# Patient Record
Sex: Female | Born: 1965 | Race: White | Hispanic: No | Marital: Single | Smoking: Never smoker
Health system: Southern US, Community
[De-identification: ages and names within clinical notes are randomized; demographics above are authoritative.]

## PROBLEM LIST (undated history)

## (undated) DIAGNOSIS — K449 Diaphragmatic hernia without obstruction or gangrene: Secondary | ICD-10-CM

## (undated) DIAGNOSIS — K633 Ulcer of intestine: Secondary | ICD-10-CM

## (undated) DIAGNOSIS — K259 Gastric ulcer, unspecified as acute or chronic, without hemorrhage or perforation: Secondary | ICD-10-CM

## (undated) DIAGNOSIS — M1990 Unspecified osteoarthritis, unspecified site: Secondary | ICD-10-CM

## (undated) DIAGNOSIS — K562 Volvulus: Secondary | ICD-10-CM

## (undated) DIAGNOSIS — K219 Gastro-esophageal reflux disease without esophagitis: Secondary | ICD-10-CM

## (undated) DIAGNOSIS — R011 Cardiac murmur, unspecified: Secondary | ICD-10-CM

## (undated) HISTORY — DX: Unspecified osteoarthritis, unspecified site: M19.90

## (undated) HISTORY — DX: Ulcer of intestine: K63.3

## (undated) HISTORY — PX: COLONOSCOPY: SHX174

## (undated) HISTORY — DX: Cardiac murmur, unspecified: R01.1

## (undated) HISTORY — DX: Volvulus: K56.2

## (undated) HISTORY — DX: Gastric ulcer, unspecified as acute or chronic, without hemorrhage or perforation: K25.9

## (undated) HISTORY — DX: Diaphragmatic hernia without obstruction or gangrene: K44.9

## (undated) HISTORY — DX: Gastro-esophageal reflux disease without esophagitis: K21.9

## (undated) HISTORY — PX: UPPER GASTROINTESTINAL ENDOSCOPY: SHX188

---

## 2005-07-28 ENCOUNTER — Ambulatory Visit: Payer: Self-pay | Admitting: Family Medicine

## 2006-11-16 ENCOUNTER — Ambulatory Visit: Payer: Self-pay | Admitting: Gastroenterology

## 2006-11-24 ENCOUNTER — Ambulatory Visit: Payer: Self-pay | Admitting: Gastroenterology

## 2006-11-27 ENCOUNTER — Ambulatory Visit: Payer: Self-pay | Admitting: Gastroenterology

## 2006-11-29 ENCOUNTER — Ambulatory Visit: Payer: Self-pay | Admitting: Gastroenterology

## 2006-11-29 LAB — CONVERTED CEMR LAB
CO2: 32 meq/L (ref 19–32)
Chloride: 103 meq/L (ref 96–112)
GFR calc Af Amer: 320 mL/min

## 2006-12-02 ENCOUNTER — Encounter: Admission: RE | Admit: 2006-12-02 | Discharge: 2006-12-02 | Payer: Self-pay | Admitting: Gastroenterology

## 2007-07-16 ENCOUNTER — Ambulatory Visit: Payer: Self-pay | Admitting: Gastroenterology

## 2007-09-03 ENCOUNTER — Ambulatory Visit: Payer: Self-pay | Admitting: Gastroenterology

## 2011-02-22 NOTE — Assessment & Plan Note (Signed)
[facility]                         GASTROENTEROLOGY OFFICE NOTE   Dellis Anes                      MRN:          [id number]  DATE:09/03/2007                            DOB:          [birth year]    [name] returns with a dramatic improvement in symptoms. She was  eating salads very frequently and discontinued this. She also moderated  her fiber intake. In addition, she completed a course of Xifaxan. She  has been feeling much better and notes only occasional mild bloating.  She has not had any significant diarrhea or constipation. She states  that she has used clinidium only once in the past few weeks. I have  reviewed her records that were received from her gastroenterologist in  [place] and they are included on the chart.   CURRENT MEDICATIONS:  As listed on the chart; updated and reviewed.   MEDICATION ALLERGIES:  BACTRIM, MACROBID, CIPRO AND FLAGYL.   PHYSICAL EXAMINATION:  In no acute distress. Weight 131.2 pounds, blood  pressure 98/52, pulse 70 and regular. She is not re-examined.   ASSESSMENT/PLAN:  Irritable bowel syndrome. Maintain a moderate fiber  diet with adequate fluid intake. Avoid salads and milk products. May use  MiraLax and clinidium as needed. Return office visit with me p.r.n.     Venita Lick. Russella Dar, MD, [facility]  Electronically Signed    MTS/MedQ  DD: 09/03/2007  DT: 09/03/2007  Job #: [id number]   cc:   Paulita Cradle, FNP

## 2011-02-25 NOTE — Assessment & Plan Note (Signed)
[facility]                         GASTROENTEROLOGY OFFICE NOTE   Dellis Anes                      MRN:          [id number]  DATE:07/16/2007                            DOB:          1965/12/18    REFERRING PHYSICIAN:  Birdena Jubilee, NP   REASON FOR CONSULT:  Abdominal pain, gas, bloating, hematochezia, and  alternating diarrhea and constipation.   HISTORY OF PRESENT ILLNESS:  Ms. Sandra Ortiz is a 45 year old white  female that I have previously evaluated.  She underwent upper endoscopy  for epigastric pain in February 2008 that was normal.  She was treated  for possible reflux.  Abdominal ultrasound showed a 2.4 mm heterogenous  hyperechoic hepatic lesion, and was otherwise unremarkable.  An MR of  the abdomen with and without contrast on December 02, 2006, showed the  liver lesion had characteristics of a benign hepatic hemangioma.  Small  benign renal cysts were noted.  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  Liver function tests from February 2008 were normal.  She  underwent colonoscopy previously by Dr. Diamantina Monks in [place],  [place], in December 2006.  I have biopsy reports but not the procedure  report.  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  Stool studies from November 2006 for enteric pathogens and ova  and parasites were negative.  In addition, she had a small bowel series  performed in January 2007, in [place], [place] that was normal.  She  relates no weight loss or appetite change.  She states the pain has been  particularly bothersome for the past 3 months.  It occurred on a daily  basis and is generally in the left lower quadrant.  She has significant  gas and bloating after meals associated with visible swelling.  She  occasionally can go several days between bowel movements and then other  days she  has 4 or more bowel movements a day.  She has noted bright red  rectal bleeding with constipation and straining.  Family history is  negative for colon cancer, colon polyps, or inflammatory bowel disease.   PAST MEDICAL HISTORY:  1. Spinal arthritis and as in the history of present illness.  2. She is status post an exploratory surgery 20 years ago.   CURRENT MEDICATIONS:  Listed on the chart, updated, and reviewed.   MEDICATION ALLERGIES:  BACTRIM, MACROBID, CIPRO, AND FLAGYL.   SOCIAL HISTORY AND REVIEW OF SYSTEMS:  Per the handwritten form.   PHYSICAL EXAM:  Well-developed, well-nourished, no acute distress.  Height 5 feet 2 inches, weight 130.2 pounds, blood pressure 102/66,  pulse 80 and regular.  HEENT:  Anicteric sclerae, oropharynx clear.  CHEST:  Clear to auscultation bilaterally.  CARDIAC:  Regular rate and rhythm without murmurs appreciated.  ABDOMEN:  Soft with minimal left lower quadrant tenderness to deep  palpation.  No rebound or guarding.  No palpable organomegaly, masses,  or hernias.  Normoactive bowel sounds.  NEUROLOGIC:  Alert and oriented x3.  Grossly nonfocal.   ASSESSMENT AND PLAN:  Alternating diarrhea and constipation associated  with small volume hematochezia.  I suspect she has underlying irritable  bowel syndrome and a benign anorectal source of bleeding.  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  She does not  have ongoing diarrhea so her current symptoms are not typical for an  active diagnosis of microscopic colitis. Begin a high fiber diet with  adequate fluid intake, begin a course of Xifaxan 400 mg t.i.d. x10 days.  She is advised to use her Clinidium b.i.d.  She may use MiraLax 1 to 3  times daily for management of constipation, and she will hold this for  diarrhea.  Obtain her colonoscopy report from Ali Molina. Consider a  repeat colonoscopy for further evaluation. Consider serologies for  celiac disease and  inflammatory bowel disease. Return office visit in 6-  8 weeks.     Venita Lick. Russella Dar, MD, [facility]  Electronically Signed    MTS/MedQ  DD: 07/30/2007  DT: 07/31/2007  Job #: [id number]   cc:   Birdena Jubilee, NP

## 2011-11-14 ENCOUNTER — Encounter: Payer: Self-pay | Admitting: Cardiology

## 2011-11-14 ENCOUNTER — Ambulatory Visit (INDEPENDENT_AMBULATORY_CARE_PROVIDER_SITE_OTHER): Payer: BC Managed Care – PPO | Admitting: Cardiology

## 2011-11-14 VITALS — BP 138/85 | HR 98 | Resp 16 | Ht 62.0 in | Wt 137.0 lb

## 2011-11-14 DIAGNOSIS — R002 Palpitations: Secondary | ICD-10-CM

## 2011-11-14 DIAGNOSIS — R079 Chest pain, unspecified: Secondary | ICD-10-CM | POA: Insufficient documentation

## 2011-11-14 DIAGNOSIS — R011 Cardiac murmur, unspecified: Secondary | ICD-10-CM

## 2011-11-14 NOTE — Progress Notes (Signed)
   Clinical Summary Sandra Ortiz is a 46 y.o.female referred by Sandra Ortiz for evaluation of chest pain. We discussed her symptoms. She reports onset of a sporadic chest discomfort, initially dull then sharp, beginning back in November of 2012. Symptoms were always at rest, never exertional. Episodes would last for a few minutes, went away without any specific intervention. These symptoms gradually progressed in intensity and frequency, and she began to experience some discomfort in her left arm with paresthesias in her left forearm. Again, none of these have been exertional. She was however scared to continue her usual exercise regimen on the treadmill each morning.  She also indicates during this time a sensation of "fluttering" in her chest, not always with chest discomfort however. She had no dizziness or syncope.  Recent ECG reviewed showing sinus rhythm with short PR, no definite delta waves.  She reports no history of cardiac arrhythmia. States she was told she had a heart murmur fairly recently, was not aware of this as a child.   Allergies  Allergen Reactions  . Ciprofloxacin   . Flagyl (Metronidazole Hcl)   . Macrobid   . Sulphur (Sulfur Sublimed)     Current Outpatient Prescriptions  Medication Sig Dispense Refill  . Ascorbic Acid (VITAMIN C PO) Take by mouth daily.      . Glucosamine-Chondroitin (GLUCOSAMINE CHONDR COMPLEX PO) Take by mouth daily.      . Multiple Minerals (CALCIUM/MAGNESIUM/ZINC PO) Take by mouth daily.      . Multiple Vitamins-Minerals (MULTI COMPLETE PO) Take by mouth daily.      . Probiotic Product (ALIGN PO) Take by mouth daily.        Past Medical History  Diagnosis Date  . Heart murmur     History reviewed. No pertinent past surgical history.  Family History  Problem Relation Age of Onset  . Coronary artery disease Maternal Grandmother   . Coronary artery disease Maternal Aunt     Social History Sandra Ortiz reports that she has never smoked. She  has never used smokeless tobacco. Sandra Ortiz reports that she does not drink alcohol.  Review of Systems No recent fevers or chills. Reports yearly influenza vaccine. No sudden syncope, no orthopnea or PND. Reports stable appetite. No melena or hematochezia. Otherwise reviewed and negative.  Physical Examination Filed Vitals:   11/14/11 0958  BP: 138/85  Pulse: 98  Resp: 16   Normally nourished appearing woman in no acute distress. HEENT: Conjunctiva and lids normal, oropharynx clear with moist mucosa. Neck: Supple, no elevated JVP or carotid bruits, no thyromegaly. Lungs: Clear to auscultation, nonlabored breathing at rest. Cardiac: Regular rate and rhythm, no S3, 2/6 systolic murmur at base, no pericardial rub. Abdomen: Soft, nontender, bowel sounds present, no guarding or rebound. Extremities: No pitting edema, distal pulses 2+. Skin: Warm and dry. Musculoskeletal: No kyphosis. Neuropsychiatric: Alert and oriented x3, affect grossly appropriate.   ECG Normal sinus rhythm with PR 120, no delta waves.    Problem List and Plan

## 2011-11-14 NOTE — Assessment & Plan Note (Signed)
She has had palpitations, new since November with above symptoms, although not consistently. She reports symptoms at least once a week. Would like to obtain an event recorder to investigate further, exclude any intermittent arrhythmias that might need further evaluation. As noted, PR interval is borderline short, although there is no definite evidence of preexcitation.

## 2011-11-14 NOTE — Patient Instructions (Signed)
**Note De-Identified Sandra Ortiz Obfuscation** Your physician has recommended that you wear an event monitor. Event monitors are medical devices that record the heart's electrical activity. Doctors most often [place] these monitors to diagnose arrhythmias. Arrhythmias are problems with the speed or rhythm of the heartbeat. The monitor is a small, portable device. You can wear one while you do your normal daily activities. This is usually used to diagnose what is causing palpitations/syncope (passing out).  Your physician has requested that you have an echocardiogram. Echocardiography is a painless test that uses sound waves to create images of your heart. It provides your doctor with information about the size and shape of your heart and how well your heart's chambers and valves are working. This procedure takes approximately one hour. There are no restrictions for this procedure.  Your physician recommends that you continue on your current medications as directed. Please refer to the Current Medication list given to you today.  Your physician recommends that you schedule a follow-up appointment in: 2 to 3 weeks

## 2011-11-14 NOTE — Assessment & Plan Note (Signed)
Very atypical in description, nonexertional, not associated with any increasing shortness of breath. She has had some left arm symptoms associated as well, although could be neuropathic based on description. She has had some palpitations with these symptoms, although not consistently. Her ECG shows a borderline short PR interval, no definite preexcitation however, otherwise normal. Likelihood of ischemia as etiology is quite low.

## 2011-11-14 NOTE — Assessment & Plan Note (Signed)
Reportedly noted as an adult. Particularly in light of above symptoms as well, a 2-D echocardiogram will be obtained.

## 2011-11-15 ENCOUNTER — Ambulatory Visit (HOSPITAL_COMMUNITY)
Admission: RE | Admit: 2011-11-15 | Discharge: 2011-11-15 | Disposition: A | Discharge status: Home / Self Care | Payer: BC Managed Care – PPO | Source: Ambulatory Visit | Attending: Cardiology | Admitting: Cardiology

## 2011-11-15 DIAGNOSIS — R079 Chest pain, unspecified: Secondary | ICD-10-CM | POA: Insufficient documentation

## 2011-11-15 DIAGNOSIS — Z8249 Family history of ischemic heart disease and other diseases of the circulatory system: Secondary | ICD-10-CM | POA: Insufficient documentation

## 2011-11-15 DIAGNOSIS — R011 Cardiac murmur, unspecified: Secondary | ICD-10-CM

## 2011-11-15 NOTE — Progress Notes (Signed)
*  PRELIMINARY RESULTS* Echocardiogram 2D Echocardiogram has been performed.  Sandra Ortiz 11/15/2011, 12:46 PM

## 2011-11-16 ENCOUNTER — Telehealth: Payer: Self-pay

## 2011-11-16 NOTE — Telephone Encounter (Signed)
Pt. advised of Echo results, she verbalized understanding./LV

## 2011-11-24 ENCOUNTER — Ambulatory Visit: Payer: Self-pay | Admitting: Cardiovascular Disease

## 2011-12-02 ENCOUNTER — Ambulatory Visit: Payer: BC Managed Care – PPO | Admitting: Cardiology

## 2012-01-03 ENCOUNTER — Telehealth: Payer: Self-pay

## 2012-01-03 NOTE — Telephone Encounter (Signed)
**Note De-Identified Sandra Ortiz Obfuscation** Pt. only has cell phone and [employer] was advised of this when event monitor was ordered.They stated, at that time, that this would not be a problem. We only received 1 strip at the end of pt's 7 day transmission. Pt. states that she tired for hours multiple times throughout several nights to download her monitor and was finally advised by [employer] that she has to use a land line phone. Pt. States that she then tried to download monitor while at work but was put on hold for over a half hour period each time. Pt. states that she got frustrated and sent monitor back to [employer]. Becky, with [employer], states that pt. lost transmission and never called to obtain another baseline. I left a message on Abbie Louden's, account executive for [employer], VM asking her to call me concerning this matter./LV

## 2012-01-05 NOTE — Telephone Encounter (Signed)
**Note De-Identified Sandra Ortiz Obfuscation** Efrain Sella came by office this morning and stated that [employer]'s records do not show that pt. contacted them to down load monitor or that she was placed on hold as pt. reported. Efrain Sella also states that she is continuing to try to get another event monitor to pt. and that she will update [place] on progress./LV

## 2012-01-10 ENCOUNTER — Other Ambulatory Visit: Payer: Self-pay | Admitting: Cardiology

## 2012-01-10 DIAGNOSIS — R002 Palpitations: Secondary | ICD-10-CM

## 2013-09-16 ENCOUNTER — Encounter: Payer: Self-pay | Admitting: Family Medicine

## 2013-09-16 ENCOUNTER — Ambulatory Visit (INDEPENDENT_AMBULATORY_CARE_PROVIDER_SITE_OTHER): Payer: BC Managed Care – PPO | Admitting: Family Medicine

## 2013-09-16 VITALS — BP 102/69 | HR 71 | Temp 97.4°F | Wt 138.0 lb

## 2013-09-16 DIAGNOSIS — J40 Bronchitis, not specified as acute or chronic: Secondary | ICD-10-CM

## 2013-09-16 DIAGNOSIS — R062 Wheezing: Secondary | ICD-10-CM

## 2013-09-16 DIAGNOSIS — J069 Acute upper respiratory infection, unspecified: Secondary | ICD-10-CM

## 2013-09-16 MED ORDER — BENZONATATE 100 MG PO CAPS: 100.0000 mg | ORAL_CAPSULE | Freq: Three times a day (TID) | ORAL | Status: DC | PRN

## 2013-09-16 MED ORDER — DOXYCYCLINE HYCLATE 100 MG PO CAPS: 100.0000 mg | ORAL_CAPSULE | Freq: Two times a day (BID) | ORAL | Status: DC

## 2013-09-16 MED ORDER — METHYLPREDNISOLONE ACETATE 80 MG/ML IJ SUSP
80.0000 mg | Freq: Once | INTRAMUSCULAR | Status: AC
  Administered 2013-09-16: 80 mg via INTRAMUSCULAR

## 2013-09-16 MED ORDER — METHYLPREDNISOLONE ACETATE 40 MG/ML IJ SUSP: 80.0000 mg | Freq: Once | INTRAMUSCULAR | Status: DC

## 2013-09-16 NOTE — Progress Notes (Signed)
   Subjective:    Patient ID: Sandra Ortiz, female    DOB: [birth year], 47 y.o.   MRN: [id number]  HPI URI Symptoms Onset: 7-10 days  Description: sinus pressure, nasal congestion, post nasal drip cough, wheezing  Modifying factors:  none  Symptoms Nasal discharge: yes Fever: no Sore throat: no Cough: yes Wheezing: yes Ear pain: no GI symptoms: no Sick contacts: yes  Red Flags  Stiff neck: no Dyspnea: no Rash: no Swallowing difficulty: no  Sinusitis Risk Factors Headache/face pain: no Double sickening: no tooth pain: no  Allergy Risk Factors Sneezing: no Itchy scratchy throat: no Seasonal symptoms: no  Flu Risk Factors Headache: no muscle aches: no severe fatigue: no     Review of Systems  All other systems reviewed and are negative.       Objective:   Physical Exam  Constitutional: She appears well-developed and well-nourished.  HENT:  Head: Normocephalic and atraumatic.  Right Ear: External ear normal.  Left Ear: External ear normal.  +nasal erythema, rhinorrhea bilaterally, + post oropharyngeal erythema    Eyes: Conjunctivae are normal. Pupils are equal, round, and reactive to light.  Neck: Normal range of motion. Neck supple.  Cardiovascular: Normal rate and regular rhythm.   Pulmonary/Chest: Effort normal.  Faint wheezes in bases   Abdominal: Soft.  Musculoskeletal: Normal range of motion.  Neurological: She is alert.  Skin: Skin is warm.          Assessment & Plan:  URI (upper respiratory infection)  Wheezing - Plan: methylPREDNISolone acetate (DEPO-MEDROL) injection 80 mg  Bronchitis - Plan: doxycycline (VIBRAMYCIN) 100 MG capsule, benzonatate (TESSALON) 100 MG capsule  Suspect viral source of sxs overall  Depomedrol 80mg  IM x1 for wheezing  Will place on course of doxy for lower and upper resp coverage give duration of sxs.  Tessalon perles for cough  Discussed infectious and resp red flags at length with pt.  Follow up  as needed.

## 2013-10-01 ENCOUNTER — Telehealth: Payer: Self-pay | Admitting: Family Medicine

## 2013-10-10 NOTE — Telephone Encounter (Signed)
This is likely viral  Was on extended course of doxy- should have been adequate coverage.  Will need a follow up appt if she is not better. May need course of steroids if she is wheezing.

## 2013-10-15 NOTE — Telephone Encounter (Signed)
Left detailed message on patients personal voice mail.

## 2014-10-20 ENCOUNTER — Telehealth: Payer: Self-pay | Admitting: Nurse Practitioner

## 2014-10-20 NOTE — Telephone Encounter (Signed)
Appointment given for Friday but notified if the pain gets worse to call us back or go to the ER.

## 2014-10-24 ENCOUNTER — Encounter: Payer: Self-pay | Admitting: Family Medicine

## 2014-10-24 ENCOUNTER — Ambulatory Visit (INDEPENDENT_AMBULATORY_CARE_PROVIDER_SITE_OTHER): Payer: BC Managed Care – PPO

## 2014-10-24 ENCOUNTER — Ambulatory Visit (INDEPENDENT_AMBULATORY_CARE_PROVIDER_SITE_OTHER): Payer: BC Managed Care – PPO | Admitting: Family Medicine

## 2014-10-24 VITALS — BP 121/76 | HR 89 | Temp 98.0°F | Ht 62.0 in | Wt 145.8 lb

## 2014-10-24 DIAGNOSIS — R1031 Right lower quadrant pain: Secondary | ICD-10-CM

## 2014-10-24 LAB — POCT CBC
GRANULOCYTE PERCENT: 68.5 % (ref 37–80)
HCT, POC: 41.2 % (ref 37.7–47.9)
HEMOGLOBIN: 13.2 g/dL (ref 12.2–16.2)
Lymph, poc: 2.1 (ref 0.6–3.4)
MCH, POC: 28.4 pg (ref 27–31.2)
MCHC: 32.1 g/dL (ref 31.8–35.4)
MCV: 88.5 fL (ref 80–97)
MPV: 7.3 fL (ref 0–99.8)
POC GRANULOCYTE: 5 (ref 2–6.9)
POC LYMPH PERCENT: 28.3 %L (ref 10–50)
Platelet Count, POC: 362 10*3/uL (ref 142–424)
RBC: 4.7 M/uL (ref 4.04–5.48)
RDW, POC: 12.6 %
WBC: 7.3 10*3/uL (ref 4.6–10.2)

## 2014-10-24 NOTE — Patient Instructions (Signed)
Constipation Constipation is when a person has fewer than three bowel movements a week, has difficulty having a bowel movement, or has stools that are dry, hard, or larger than normal. As people grow older, constipation is more common. If you try to fix constipation with medicines that make you have a bowel movement (laxatives), the problem may get worse. Long-term laxative use may cause the muscles of the colon to become weak. A low-fiber diet, not taking in enough fluids, and taking certain medicines may make constipation worse.  CAUSES   Certain medicines, such as antidepressants, pain medicine, iron supplements, antacids, and water pills.   Certain diseases, such as diabetes, irritable bowel syndrome (IBS), thyroid disease, or depression.   Not drinking enough water.   Not eating enough fiber-rich foods.   Stress or travel.   Lack of physical activity or exercise.   Ignoring the urge to have a bowel movement.   Using laxatives too much.  SIGNS AND SYMPTOMS   Having fewer than three bowel movements a week.   Straining to have a bowel movement.   Having stools that are hard, dry, or larger than normal.     Maintenance: USe colace 100 mg twice daily to soften stool. Metamucil or similar fiber supplement, 1 tbsp twice a day  Use either a colon cleanse from [facility] Or buy Magnesium Citrate 10 oz. Bottle 1 daily until   Feeling full or bloated.   Pain in the lower abdomen.   Not feeling relief after having a bowel movement.  DIAGNOSIS  Your health care provider will take a medical history and perform a physical exam. Further testing may be done for severe constipation. Some tests may include:  A barium enema X-ray to examine your rectum, colon, and, sometimes, your small intestine.   A sigmoidoscopy to examine your lower colon.   A colonoscopy to examine your entire colon. TREATMENT  Treatment will depend on the severity of your constipation and what is  causing it. Some dietary treatments include drinking more fluids and eating more fiber-rich foods. Lifestyle treatments may include regular exercise. If these diet and lifestyle recommendations do not help, your health care provider may recommend taking over-the-counter laxative medicines to help you have bowel movements. Prescription medicines may be prescribed if over-the-counter medicines do not work.  HOME CARE INSTRUCTIONS   Eat foods that have a lot of fiber, such as fruits, vegetables, whole grains, and beans.  Limit foods high in fat and processed sugars, such as french fries, hamburgers, cookies, candies, and soda.   A fiber supplement may be added to your diet if you cannot get enough fiber from foods.   Drink enough fluids to keep your urine clear or pale yellow.   Exercise regularly or as directed by your health care provider.   Go to the restroom when you have the urge to go. Do not hold it.   Only take over-the-counter or prescription medicines as directed by your health care provider. Do not take other medicines for constipation without talking to your health care provider first.  [place] IF:   You have bright red blood in your stool.   Your constipation lasts for more than 4 days or gets worse.   You have abdominal or rectal pain.   You have thin, pencil-like stools.   You have unexplained weight loss. MAKE SURE YOU:   Understand these instructions.  Will watch your condition.  Will get help right away if you are not  doing well or get worse. Document Released: 06/24/2004 Document Revised: 10/01/2013 Document Reviewed: 07/08/2013 [facility] Patient Information 2015 [employer] [place], [place]. This information is not intended to replace advice given to you by your health care provider. Make sure you discuss any questions you have with your health care provider.

## 2014-10-24 NOTE — Progress Notes (Signed)
Subjective:    Patient ID: Sandra Ortiz, female    DOB: 04-21-1966, 49 y.o.   MRN: [id number]  HPI  Pt is here today for abdominal pain that started about 1 month ago but has worsened over the past week. She is currently taking Prevacid and Align with no relief. She denies constipation but does complain with bloating. Patient states that she started about 2 weeks ago with cramping abdominal pain. She located the pain initially in the epigastrium. It does continue there in spite of using Prevacid otc but now she is noticing also right lower quadrant pain. She has noticed scant menstrual spotting on 2 occasions during this timeframe as well. Her bowel movements have been essentially normal. They have not been hard they have not been scant she continues to have them twice daily. There is no melena no hematochezia her appetite is poor but food does not affect her discomfort. She had 4 days ago and again one day ago acute pain onset upon arising at approximately 5 AM and continuing until 9 AM. The pain was decided described as cramping and bloating and moderately severe it did not prevent her from performing her activities however she was at work and set the dictated if it did not resolve very soon that she would have to leave at which time it did resolve within that timeframe. However the full length of the pain was 4 hours during which time she got ready for work had breakfast and drove to work and started her work day. She did have a bowel movement shortly before the end of the pain on each occasion. She has had colonoscopy in the past due to some rectal bleeding remotely in the past. No colon abnormalities have been noted since that time. Patient has not had any abdominal surgery including cholecystectomy or appendectomy. She denies history of kidney disease including nephrolithiasis. There has been no dysuria or urgency of urination ring this episode. The episodes resolved spontaneously but there is a  low-grade crampy and full sensation that persists constantly.    Review of Systems  Constitutional: Positive for appetite change. Negative for fever, chills, diaphoresis, fatigue and unexpected weight change.       Appetite diminished  HENT: Negative for congestion, ear pain, hearing loss, postnasal drip, rhinorrhea, sneezing, sore throat and trouble swallowing.   Eyes: Negative for pain.  Respiratory: Negative for cough, chest tightness and shortness of breath.   Cardiovascular: Negative for chest pain and palpitations.  Gastrointestinal: Positive for abdominal pain and abdominal distention. Negative for nausea, vomiting, diarrhea, constipation, blood in stool and rectal pain.  Genitourinary: Negative for dysuria, frequency and menstrual problem.  Musculoskeletal: Negative for joint swelling and arthralgias.  Skin: Negative for rash.  Neurological: Negative for dizziness, weakness, numbness and headaches.  Psychiatric/Behavioral: Negative for dysphoric mood and agitation.       Objective:   Physical Exam  Constitutional: She is oriented to person, place, and time. She appears well-developed and well-nourished. No distress.  HENT:  Head: Normocephalic and atraumatic.  Right Ear: External ear normal.  Left Ear: External ear normal.  Nose: Nose normal.  Mouth/Throat: Oropharynx is clear and moist.  Eyes: Conjunctivae and EOM are normal. Pupils are equal, round, and reactive to light.  Neck: Normal range of motion. Neck supple. No thyromegaly present.  Cardiovascular: Normal rate, regular rhythm and normal heart sounds.   No murmur heard. Pulmonary/Chest: Effort normal and breath sounds normal. No respiratory distress. She has no wheezes.  She has no rales.  Abdominal: Soft. Bowel sounds are normal. She exhibits distension. She exhibits no mass. There is tenderness. There is no rebound and no guarding.  Lymphadenopathy:    She has no cervical adenopathy.  Neurological: She is alert  and oriented to person, place, and time. She has normal reflexes.  Skin: Skin is warm and dry.  Psychiatric: She has a normal mood and affect. Her behavior is normal. Judgment and thought content normal.  Vitals reviewed.  BP 121/76 mmHg  Pulse 89  Temp(Src) 98 F (36.7 C) (Oral)  Ht $R'5\' 2"'ui$  (1.575 m)  Wt 145 lb 12.8 oz (66.134 kg)  BMI 26.66 kg/m2  LMP 10/08/2014        Assessment & Plan:  1. Right lower quadrant abdominal pain, with some epigastric pain noted as well.  - DG Abd 1 View showing increased stool throughout colon - POCT CBC - CMP14+EGFR - Amylase - Lipase  Start daily use of Metamucil 1 tablespoon twice daily and Colace 100 mg twice daily.  You may use a colon cleanse or magnesium citrate to relieve the current constipation and rely on the Metamucil and Colace as prevention. Continue the Prevacid at 30 mg daily for 4-6 weeks.

## 2014-10-25 LAB — CMP14+EGFR
A/G RATIO: 1.8 (ref 1.1–2.5)
ALT: 16 IU/L (ref 0–32)
AST: 18 IU/L (ref 0–40)
Albumin: 4.7 g/dL (ref 3.5–5.5)
Alkaline Phosphatase: 69 IU/L (ref 39–117)
BILIRUBIN TOTAL: 0.4 mg/dL (ref 0.0–1.2)
BUN/Creatinine Ratio: 21 (ref 9–23)
BUN: 14 mg/dL (ref 6–24)
CALCIUM: 9.4 mg/dL (ref 8.7–10.2)
CO2: 25 mmol/L (ref 18–29)
CREATININE: 0.66 mg/dL (ref 0.57–1.00)
Chloride: 103 mmol/L (ref 97–108)
GFR calc Af Amer: 123 mL/min/{1.73_m2} (ref >59)
GFR calc non Af Amer: 106 mL/min/{1.73_m2} (ref >59)
GLOBULIN, TOTAL: 2.6 g/dL (ref 1.5–4.5)
Glucose: 96 mg/dL (ref 65–99)
POTASSIUM: 4.3 mmol/L (ref 3.5–5.2)
SODIUM: 138 mmol/L (ref 134–144)
Total Protein: 7.3 g/dL (ref 6.0–8.5)

## 2014-10-25 LAB — AMYLASE: AMYLASE: 50 U/L (ref 31–124)

## 2014-10-25 LAB — LIPASE: Lipase: 15 U/L (ref 0–59)

## 2015-01-26 ENCOUNTER — Encounter: Payer: Self-pay | Admitting: Family Medicine

## 2015-01-26 ENCOUNTER — Ambulatory Visit (INDEPENDENT_AMBULATORY_CARE_PROVIDER_SITE_OTHER): Payer: BC Managed Care – PPO | Admitting: Family Medicine

## 2015-01-26 VITALS — BP 115/75 | HR 74 | Temp 97.9°F | Ht 62.0 in | Wt 141.0 lb

## 2015-01-26 DIAGNOSIS — W57XXXA Bitten or stung by nonvenomous insect and other nonvenomous arthropods, initial encounter: Secondary | ICD-10-CM

## 2015-01-26 DIAGNOSIS — S0006XA Insect bite (nonvenomous) of scalp, initial encounter: Secondary | ICD-10-CM

## 2015-01-26 MED ORDER — DOXYCYCLINE HYCLATE 100 MG PO TABS: 100.0000 mg | ORAL_TABLET | Freq: Two times a day (BID) | ORAL | Status: DC

## 2015-01-26 NOTE — Progress Notes (Signed)
Subjective:  Patient ID: Sandra Ortiz, female    DOB: 31-Aug-1966  Age: 49 y.o. MRN: [id number]  CC: Insect Bite   HPI Sandra Ortiz presents for patient pulled the tick off of her right temple area yesterday evening. There is a mild reaction to it and some soreness. Patient is concerned about the possibility of tickborne diseases. The tick was not brought in but she states that it was more the size of the pencil eraser than the head of a pin indicating likely common dog tick was responsible for the bite.   History Sandra Ortiz has a past medical history of Heart murmur and Arthritis.   She has no past surgical history on file.   Her family history includes Coronary artery disease in her maternal aunt and maternal grandmother; Hyperlipidemia in her father; Hypertension in her father.She reports that she has never smoked. She has never used smokeless tobacco. She reports that she does not drink alcohol or use illicit drugs.  Current Outpatient Prescriptions on File Prior to Visit  Medication Sig Dispense Refill  . Probiotic Product (ALIGN PO) Take by mouth daily.     No current facility-administered medications on file prior to visit.    ROS Review of Systems  Constitutional: Negative for fever, chills, diaphoresis, appetite change and fatigue.  HENT: Positive for facial swelling. Negative for congestion, ear pain, hearing loss, postnasal drip, rhinorrhea, sore throat and trouble swallowing.   Respiratory: Negative for cough, chest tightness and shortness of breath.   Cardiovascular: Negative for chest pain and palpitations.  Gastrointestinal: Negative for abdominal pain.  Musculoskeletal: Negative for arthralgias.  Skin: Negative for rash.  Neurological: Positive for numbness (right face).    Objective:  BP 115/75 mmHg  Pulse 74  Temp(Src) 97.9 F (36.6 C) (Oral)  Ht 5\' 2"  (1.575 m)  Wt 141 lb (63.957 kg)  BMI 25.78 kg/m2  BP Readings from Last 3 Encounters:  01/26/15  115/75  10/24/14 121/76  09/16/13 102/69    Wt Readings from Last 3 Encounters:  01/26/15 141 lb (63.957 kg)  10/24/14 145 lb 12.8 oz (66.134 kg)  09/16/13 138 lb (62.596 kg)     Physical Exam  Constitutional: She is oriented to person, place, and time. She appears well-developed and well-nourished. No distress.  HENT:  Head: Normocephalic and atraumatic.  Right Ear: External ear normal.  Left Ear: External ear normal.  Nose: Nose normal.  Mouth/Throat: Oropharynx is clear and moist.  Eyes: Conjunctivae and EOM are normal. Pupils are equal, round, and reactive to light.  Neck: Normal range of motion. Neck supple. No thyromegaly present.  Cardiovascular: Normal rate, regular rhythm and normal heart sounds.   No murmur heard. Pulmonary/Chest: Effort normal and breath sounds normal. No respiratory distress. She has no wheezes. She has no rales.  Abdominal: Soft. Bowel sounds are normal. She exhibits no distension. There is no tenderness.  Lymphadenopathy:    She has no cervical adenopathy.  Neurological: She is alert and oriented to person, place, and time. She has normal reflexes.  Skin: Skin is warm and dry.  Psychiatric: She has a normal mood and affect. Her behavior is normal. Judgment and thought content normal.    No results found for: HGBA1C  Lab Results  Component Value Date   WBC 7.3 10/24/2014   HGB 13.2 10/24/2014   HCT 41.2 10/24/2014   GLUCOSE 96 10/24/2014   ALT 16 10/24/2014   AST 18 10/24/2014   NA 138 10/24/2014  K 4.3 10/24/2014   CL 103 10/24/2014   CREATININE 0.66 10/24/2014   BUN 14 10/24/2014   CO2 25 10/24/2014    No results found.  Assessment & Plan:   Sandra Ortiz was seen today for insect bite.  Diagnoses and all orders for this visit:  Tick bite of scalp, initial encounter  Other orders -     doxycycline (VIBRA-TABS) 100 MG tablet; Take 1 tablet (100 mg total) by mouth 2 (two) times daily.  I have discontinued Sandra Ortiz's  lansoprazole. I am also having her start on doxycycline. Additionally, I am having her maintain her Probiotic Product (ALIGN PO) and multivitamin with minerals.  Meds ordered this encounter  Medications  . Multiple Vitamin (MULTIVITAMIN WITH MINERALS) TABS tablet    Sig: Take 1 tablet by mouth daily.  Marland Kitchen doxycycline (VIBRA-TABS) 100 MG tablet    Sig: Take 1 tablet (100 mg total) by mouth 2 (two) times daily.    Dispense:  20 tablet    Refill:  0     Follow-up: Return if symptoms worsen or fail to improve.  Claretta Fraise, M.D.

## 2015-12-16 ENCOUNTER — Telehealth: Payer: Self-pay | Admitting: Family Medicine

## 2015-12-21 NOTE — Telephone Encounter (Signed)
We do not give yellow fever here patient would have to go to travel clinic and the number is [phone]. LM for patient to call us back.

## 2015-12-21 NOTE — Telephone Encounter (Signed)
Patient aware.

## 2016-03-22 ENCOUNTER — Ambulatory Visit: Payer: BC Managed Care – PPO | Admitting: Family Medicine

## 2019-08-28 NOTE — Progress Notes (Signed)
New Patient Office Visit  Assessment & Plan:  1. Well adult exam - Preventive care education provided. Patient declined HIV screening and influenza vaccine. Her tetanus is up-to-date. Shingrix has been sent to the pharmacy for administration. Pap smear and mammogram have been requested from the [facility] care clinic. She is being referred for a colonoscopy.  2. Elevated BP without diagnosis of hypertension - Encouraged patient to monitor BP and keep a log. Discussed healthy eating and exercise which were encouraged. She is to let me know if BP is consistently greater than 140/90.  3. Fluid collection of middle ear - Encouraged to take antihistamine daily. May also use Flonase.  4. Colon cancer screening - Ambulatory referral to Gastroenterology  5. Immunization due - SHINGRIX injection; Inject 0.5 mLs into the muscle once for 1 dose.  Dispense: 0.5 mL; Refill: 0   Follow-up: Return in about 3 months (around 11/29/2019) for BP.   Hendricks Limes, MSN, APRN, FNP-C [facility]  Subjective:  Patient ID: Sandra Ortiz, female    DOB: 07/11/1966  Age: 53 y.o. MRN: [id number]  Patient Care Team: Loman Brooklyn, FNP as PCP - General (Family Medicine)  CC:  Chief Complaint  Patient presents with   New Patient (Initial Visit)    HPI Sandra Ortiz presents to establish care.  She is not new to the office but it has been greater than 3 years since her last appointment and is therefore considered a new patient.   Patient reports she frequently experiences a buildup of fluid in her ears that causes vertigo. When this occurs she takes Sudafed and Flonase which improves symptoms. She does not take a daily allergy medication.  She is slightly concerned about her blood pressure. She reports a few weeks ago she had a bad headache and her blood pressure was 155/111. Many times when she experiences these headaches her blood pressure is elevated, but there have  been times when her blood pressure is elevated without a headache. She reports this is not the norm for her. Most of her BP readings are less than 140/90.   Review of Systems  Constitutional: Negative for chills, fever, malaise/fatigue and weight loss.  HENT: Negative for congestion, ear discharge, ear pain, nosebleeds, sinus pain, sore throat and tinnitus.   Eyes: Negative for blurred vision, double vision, pain, discharge and redness.  Respiratory: Negative for cough, shortness of breath and wheezing.   Cardiovascular: Negative for chest pain, palpitations and leg swelling.  Gastrointestinal: Positive for abdominal pain. Negative for constipation, diarrhea, heartburn, nausea and vomiting.  Genitourinary: Negative for dysuria, frequency and urgency.  Musculoskeletal: Negative for myalgias.  Skin: Negative for rash.  Neurological: Positive for dizziness and headaches. Negative for seizures and weakness.  Psychiatric/Behavioral: Negative for depression, substance abuse and suicidal ideas. The patient is not nervous/anxious.     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 No pertinent surgical history.  Family History  Problem Relation Age of Onset   Hypertension Father    Hyperlipidemia Father    Coronary artery disease Maternal Grandmother    Coronary artery disease Maternal Aunt    Emphysema Paternal Grandmother    Cirrhosis Paternal Grandfather     Social History   Socioeconomic  History   Marital status: Single    Spouse name: Not on file   Number of children: Not on file   Years of education: Not on file   Highest education level: Not on file  Occupational History   Occupation: Kindergarten teaher  Scientist, product/process development strain: Not on file   Food insecurity    Worry: Not on file    Inability: Not on file   Transportation needs    Medical: Not on file    Non-medical: Not on file  Tobacco Use   Smoking status: Never Smoker   Smokeless tobacco: Never Used  Substance and Sexual Activity   Alcohol use: No   Drug use: No   Sexual activity: Not on file  Lifestyle   Physical activity    Days per week: Not on file    Minutes per session: Not on file   Stress: Not on file  Relationships   Social connections    Talks on phone: Not on file    Gets together: Not on file    Attends religious service: Not on file    Active member of club or organization: Not on file    Attends meetings of clubs or organizations: Not on file    Relationship status: Not on file   Intimate partner violence    Fear of current or ex partner: Not on file    Emotionally abused: Not on file    Physically abused: Not on file    Forced sexual activity: Not on file  Other Topics Concern   Not on file  Social History Narrative   Not on file    Objective:   Today's Vitals: BP 133/84    Pulse 74    Temp 99.3 F (37.4 C) (Temporal)    Ht 5\' 2"  (1.575 m)    Wt 154 lb 3.2 oz (69.9 kg)    LMP 06/29/2019    SpO2 99%    BMI 28.20 kg/m   Physical Exam Vitals signs reviewed.  Constitutional:      General: She is not in acute distress.    Appearance: Normal appearance. She is overweight. She is not ill-appearing, toxic-appearing or diaphoretic.  HENT:     Head: Normocephalic and atraumatic.     Right Ear: Tympanic membrane, ear canal and external ear normal. There is no impacted cerumen.     Left Ear: Tympanic membrane, ear canal and external ear  normal. There is no impacted cerumen.     Nose: Nose normal. No congestion or rhinorrhea.     Mouth/Throat:     Mouth: Mucous membranes are moist.     Pharynx: Oropharynx is clear. No oropharyngeal exudate or posterior oropharyngeal erythema.  Eyes:     General: No scleral icterus.       Right eye: No discharge.        Left eye: No discharge.     Conjunctiva/sclera: Conjunctivae normal.     Pupils: Pupils are equal, round, and reactive to light.  Neck:     Musculoskeletal: Normal range of motion and neck supple. No neck rigidity or muscular  tenderness.  Cardiovascular:     Rate and Rhythm: Normal rate and regular rhythm.     Heart sounds: Normal heart sounds. No murmur. No friction rub. No gallop.   Pulmonary:     Effort: Pulmonary effort is normal. No respiratory distress.     Breath sounds: Normal breath sounds. No stridor. No wheezing, rhonchi or rales.  Abdominal:     General: Abdomen is flat. Bowel sounds are normal. There is no distension.     Palpations: Abdomen is soft. There is no mass.     Tenderness: There is no abdominal tenderness. There is no guarding or rebound.     Hernia: No hernia is present.  Musculoskeletal: Normal range of motion.  Lymphadenopathy:     Cervical: No cervical adenopathy.  Skin:    General: Skin is warm and dry.     Capillary Refill: Capillary refill takes less than 2 seconds.  Neurological:     General: No focal deficit present.     Mental Status: She is alert and oriented to person, place, and time. Mental status is at baseline.  Psychiatric:        Mood and Affect: Mood normal.        Behavior: Behavior normal.        Thought Content: Thought content normal.        Judgment: Judgment normal.

## 2019-08-29 ENCOUNTER — Other Ambulatory Visit: Payer: Self-pay

## 2019-08-29 ENCOUNTER — Encounter: Payer: Self-pay | Admitting: Family Medicine

## 2019-08-29 ENCOUNTER — Ambulatory Visit: Payer: BC Managed Care – PPO | Admitting: Family Medicine

## 2019-08-29 VITALS — BP 133/84 | HR 74 | Temp 99.3°F | Ht 62.0 in | Wt 154.2 lb

## 2019-08-29 DIAGNOSIS — H6590 Unspecified nonsuppurative otitis media, unspecified ear: Secondary | ICD-10-CM | POA: Diagnosis not present

## 2019-08-29 DIAGNOSIS — Z23 Encounter for immunization: Secondary | ICD-10-CM

## 2019-08-29 DIAGNOSIS — R030 Elevated blood-pressure reading, without diagnosis of hypertension: Secondary | ICD-10-CM

## 2019-08-29 DIAGNOSIS — Z1211 Encounter for screening for malignant neoplasm of colon: Secondary | ICD-10-CM

## 2019-08-29 DIAGNOSIS — Z0000 Encounter for general adult medical examination without abnormal findings: Secondary | ICD-10-CM

## 2019-08-29 MED ORDER — SHINGRIX 50 MCG/0.5ML IM SUSR: 0.5000 mL | Freq: Once | INTRAMUSCULAR | 0 refills | Status: AC

## 2019-08-29 NOTE — Patient Instructions (Addendum)
Start taking either Zyrtec, Claritin, Allegra, or Xyzal once daily to prevent fluid in your ears. Flonase will also be helpful.   Keep a log of your blood pressure. Your goal is < 140/90.    Preventive Care 52-51 Years Old, Female Preventive care refers to visits with your health care provider and lifestyle choices that can promote health and wellness. This includes:  A yearly physical exam. This may also be called an annual well check.  Regular dental visits and eye exams.  Immunizations.  Screening for certain conditions.  Healthy lifestyle choices, such as eating a healthy diet, getting regular exercise, not using drugs or products that contain nicotine and tobacco, and limiting alcohol use. What can I expect for my preventive care visit? Physical exam Your health care provider will check your:  Height and weight. This may be used to calculate body mass index (BMI), which tells if you are at a healthy weight.  Heart rate and blood pressure.  Skin for abnormal spots. Counseling Your health care provider may ask you questions about your:  Alcohol, tobacco, and drug use.  Emotional well-being.  Home and relationship well-being.  Sexual activity.  Eating habits.  Work and work Statistician.  Method of birth control.  Menstrual cycle.  Pregnancy history. What immunizations do I need?  Influenza (flu) vaccine  This is recommended every year. Tetanus, diphtheria, and pertussis (Tdap) vaccine  You may need a Td booster every 10 years. Varicella (chickenpox) vaccine  You may need this if you have not been vaccinated. Zoster (shingles) vaccine  You may need this after age 63. Measles, mumps, and rubella (MMR) vaccine  You may need at least one dose of MMR if you were born in 1957 or later. You may also need a second dose. Pneumococcal conjugate (PCV13) vaccine  You may need this if you have certain conditions and were not previously vaccinated. Pneumococcal  polysaccharide (PPSV23) vaccine  You may need one or two doses if you smoke cigarettes or if you have certain conditions. Meningococcal conjugate (MenACWY) vaccine  You may need this if you have certain conditions. Hepatitis A vaccine  You may need this if you have certain conditions or if you travel or work in places where you may be exposed to hepatitis A. Hepatitis B vaccine  You may need this if you have certain conditions or if you travel or work in places where you may be exposed to hepatitis B. Haemophilus influenzae type b (Hib) vaccine  You may need this if you have certain conditions. Human papillomavirus (HPV) vaccine  If recommended by your health care provider, you may need three doses over 6 months. You may receive vaccines as individual doses or as more than one vaccine together in one shot (combination vaccines). Talk with your health care provider about the risks and benefits of combination vaccines. What tests do I need? Blood tests  Lipid and cholesterol levels. These may be checked every 5 years, or more frequently if you are over 44 years old.  Hepatitis C test.  Hepatitis B test. Screening  Lung cancer screening. You may have this screening every year starting at age 69 if you have a 30-pack-year history of smoking and currently smoke or have quit within the past 15 years.  Colorectal cancer screening. All adults should have this screening starting at age 43 and continuing until age 33. Your health care provider may recommend screening at age 69 if you are at increased risk. You will have tests  every 1-10 years, depending on your results and the type of screening test.  Diabetes screening. This is done by checking your blood sugar (glucose) after you have not eaten for a while (fasting). You may have this done every 1-3 years.  Mammogram. This may be done every 1-2 years. Talk with your health care provider about when you should start having regular  mammograms. This may depend on whether you have a family history of breast cancer.  BRCA-related cancer screening. This may be done if you have a family history of breast, ovarian, tubal, or peritoneal cancers.  Pelvic exam and Pap test. This may be done every 3 years starting at age 51. Starting at age 78, this may be done every 5 years if you have a Pap test in combination with an HPV test. Other tests  Sexually transmitted disease (STD) testing.  Bone density scan. This is done to screen for osteoporosis. You may have this scan if you are at high risk for osteoporosis. Follow these instructions at home: Eating and drinking  Eat a diet that includes fresh fruits and vegetables, whole grains, lean protein, and low-fat dairy.  Take vitamin and mineral supplements as recommended by your health care provider.  Do not drink alcohol if: ? Your health care provider tells you not to drink. ? You are pregnant, may be pregnant, or are planning to become pregnant.  If you drink alcohol: ? Limit how much you have to 0-1 drink a day. ? Be aware of how much alcohol is in your drink. In the [place] one drink equals one 12 oz bottle of beer (355 mL), one 5 oz glass of wine (148 mL), or one 1 oz glass of hard liquor (44 mL). Lifestyle  Take daily care of your teeth and gums.  Stay active. Exercise for at least 30 minutes on 5 or more days each week.  Do not use any products that contain nicotine or tobacco, such as cigarettes, e-cigarettes, and chewing tobacco. If you need help quitting, ask your health care provider.  If you are sexually active, practice safe sex. Use a condom or other form of birth control (contraception) in order to prevent pregnancy and STIs (sexually transmitted infections).  If told by your health care provider, take low-dose aspirin daily starting at age 66. What's next?  Visit your health care provider once a year for a well check visit.  Ask your health care provider  how often you should have your eyes and teeth checked.  Stay up to date on all vaccines. This information is not intended to replace advice given to you by your health care provider. Make sure you discuss any questions you have with your health care provider. Document Released: 10/23/2015 Document Revised: 06/07/2018 Document Reviewed: 06/07/2018 Elsevier Patient Education  2020 [employer].

## 2019-09-02 ENCOUNTER — Encounter: Payer: Self-pay | Admitting: Gastroenterology

## 2019-09-03 ENCOUNTER — Other Ambulatory Visit: Payer: Self-pay

## 2019-09-03 ENCOUNTER — Telehealth: Payer: Self-pay | Admitting: Family Medicine

## 2019-09-03 NOTE — Telephone Encounter (Signed)
Patient needs to schedule an appointment

## 2019-09-03 NOTE — Telephone Encounter (Signed)
What symptoms do you have? Blood with bowel movement  How long have you been sick? Since Saturday every time she has bowel movement  Have you been seen for this problem? Has appt with [facility] to have colonscopy in December   If your provider decides to give you a prescription, which pharmacy would you like for it to be sent to? [facility]   Patient informed that this information will be sent to the clinical staff for review and that they should receive a follow up call.

## 2019-09-03 NOTE — Telephone Encounter (Signed)
[email or web address]

## 2019-09-04 ENCOUNTER — Encounter: Payer: Self-pay | Admitting: Family Medicine

## 2019-09-04 ENCOUNTER — Ambulatory Visit: Payer: BC Managed Care – PPO | Admitting: Family Medicine

## 2019-09-04 VITALS — BP 120/81 | HR 70 | Temp 98.0°F | Ht 62.0 in | Wt 153.4 lb

## 2019-09-04 DIAGNOSIS — R42 Dizziness and giddiness: Secondary | ICD-10-CM

## 2019-09-04 DIAGNOSIS — K921 Melena: Secondary | ICD-10-CM | POA: Diagnosis not present

## 2019-09-04 LAB — HEMOGLOBIN, FINGERSTICK: Hemoglobin: 14.2 g/dL (ref 11.1–15.9)

## 2019-09-04 NOTE — Progress Notes (Signed)
Assessment & Plan:  1-2. Frank blood in stool/Dizziness - Patient to stay on list for GI appointment if there is a cancellation. Discussed going to ER for worsening bleeding and abdominal pain. Hgb 14.2 today.  - Fingerstick Hemoglobin   Follow up plan: Return as scheduled.  Hendricks Limes, MSN, APRN, FNP-C [facility]  Subjective:   Patient ID: Sandra Ortiz, female    DOB: [birth year], 53 y.o.   MRN: [id number]  HPI: Sandra Ortiz is a 53 y.o. female presenting on 09/04/2019 for Rectal Bleeding (diarrhea)  Patient reports she has had bright red blood in her stool for the past four days. Two days ago she did have some diarrhea. Additional symptoms include abdominal pain, dizziness, decreased appetite, and just an overall feeling of not feeling well. She has a history of a twisted colon and a colon ulcer which were diagnosed with colonoscopy in 2009. She denies any hemorrhoids. Her abdominal pain is increasing and is worse on the right side recently. She is not tender today. She was referred to GI for a routine colonoscopy and reports she has her initial consultation on December 30th. She was told her actual colonoscopy will not be until January or February. She called them to ask if her appointment could be moved up due to blood in stool and abdominal pain and was told she would be put on a list to call if there were any cancellations.    ROS: Negative unless specifically indicated above in HPI.   Relevant past medical history reviewed and updated as indicated.   Allergies and medications reviewed and updated.  No current outpatient medications on file.  Allergies  Allergen Reactions  . Ciprofloxacin   . Flagyl [Metronidazole Hcl]   . Nitrofurantoin Monohyd Macro   . Prednisone Other (See Comments)    Abdominal pain.  Melida Gimenez [Sulfur Sublimed]     Objective:   BP 120/81   Pulse 70   Temp 98 F (36.7 C) (Temporal)   Ht 5\' 2"  (1.575 m)   Wt 153  lb 6.4 oz (69.6 kg)   SpO2 100%   BMI 28.06 kg/m    Physical Exam Vitals signs reviewed.  Constitutional:      General: She is not in acute distress.    Appearance: Normal appearance. She is overweight. She is not ill-appearing, toxic-appearing or diaphoretic.  HENT:     Head: Normocephalic and atraumatic.  Eyes:     General: No scleral icterus.       Right eye: No discharge.        Left eye: No discharge.     Conjunctiva/sclera: Conjunctivae normal.  Neck:     Musculoskeletal: Normal range of motion.  Cardiovascular:     Rate and Rhythm: Normal rate.  Pulmonary:     Effort: Pulmonary effort is normal. No respiratory distress.  Musculoskeletal: Normal range of motion.  Skin:    General: Skin is warm and dry.     Capillary Refill: Capillary refill takes less than 2 seconds.  Neurological:     General: No focal deficit present.     Mental Status: She is alert and oriented to person, place, and time. Mental status is at baseline.  Psychiatric:        Mood and Affect: Mood normal.        Behavior: Behavior normal.        Thought Content: Thought content normal.        Judgment: Judgment normal.

## 2019-09-13 NOTE — Telephone Encounter (Signed)
Patient seen by Hendricks Limes on 09/04/2019.

## 2019-09-24 ENCOUNTER — Ambulatory Visit: Payer: BC Managed Care – PPO | Admitting: Gastroenterology

## 2019-09-24 ENCOUNTER — Ambulatory Visit (INDEPENDENT_AMBULATORY_CARE_PROVIDER_SITE_OTHER): Payer: BC Managed Care – PPO

## 2019-09-24 ENCOUNTER — Encounter

## 2019-09-24 ENCOUNTER — Encounter: Payer: Self-pay | Admitting: Gastroenterology

## 2019-09-24 VITALS — BP 100/72 | HR 64 | Temp 97.6°F | Ht 62.0 in | Wt 156.0 lb

## 2019-09-24 DIAGNOSIS — R1031 Right lower quadrant pain: Secondary | ICD-10-CM | POA: Diagnosis not present

## 2019-09-24 DIAGNOSIS — K219 Gastro-esophageal reflux disease without esophagitis: Secondary | ICD-10-CM

## 2019-09-24 DIAGNOSIS — Z1159 Encounter for screening for other viral diseases: Secondary | ICD-10-CM

## 2019-09-24 DIAGNOSIS — R1032 Left lower quadrant pain: Secondary | ICD-10-CM | POA: Diagnosis not present

## 2019-09-24 DIAGNOSIS — R1084 Generalized abdominal pain: Secondary | ICD-10-CM

## 2019-09-24 DIAGNOSIS — K625 Hemorrhage of anus and rectum: Secondary | ICD-10-CM

## 2019-09-24 DIAGNOSIS — R140 Abdominal distension (gaseous): Secondary | ICD-10-CM | POA: Diagnosis not present

## 2019-09-24 MED ORDER — NA SULFATE-K SULFATE-MG SULF 17.5-3.13-1.6 GM/177ML PO SOLN: 1.0000 | ORAL | 0 refills | Status: DC

## 2019-09-24 MED ORDER — RIFAXIMIN 550 MG PO TABS: 550.0000 mg | ORAL_TABLET | Freq: Three times a day (TID) | ORAL | 0 refills | Status: AC

## 2019-09-24 MED ORDER — FAMOTIDINE 20 MG PO TABS: 20.0000 mg | ORAL_TABLET | Freq: Two times a day (BID) | ORAL | 3 refills | Status: DC

## 2019-09-24 NOTE — Progress Notes (Signed)
Referring Provider: Loman Brooklyn, FNP Primary Care Physician:  Loman Brooklyn, FNP  Reason for Consultation:  Abdominal pain, flatulence, rectal bleeding during bowel movements, acid reflux, chest pain   IMPRESSION:  Abdominal pain with postprandial bloating, flatus, and distension Painless rectal bleeding New onset reflux History of colon polyps (patient report) Multiple antibiotic allergies Prior colonoscopy in 2009 or 2010 in [place] No known family history of colon cancer or polyps  New onset abdominal pain with associated increased flatulence and postprandial bloating may be due to bacterial overgrowth, infectious such as giardia, or dietary factors such as increased intake of lactulose, fructose, or sorbitol.  Differential also includes peptic ulcer disease, gastroesophageal reflux, biliary pain, gastroparesis, carbohydrate malabsorption, celiac, and functional dyspepsia.   Given this differential I am recommending that she increase her famotidine to 20 mg twice daily.  We will work to obtain empiric treatment for possible bacterial overgrowth.  Given her multiple antibiotic allergies I am hopeful that her insurance company will approve Xifaxan.  I am also recommending that we proceed with endoscopic evaluation.  With her EGD we will obtain esophageal biopsies, gastric biopsies, and duodenal biopsies.  We will also proceed with colonoscopy.  PLAN: Increase famotidine 20 mg to BID Xifaxan 550mg  TID x 14 days for empiric treatment of small intestinal bacterial overgrowth EGD with gastric and duodenal biopsies Colonoscopy Consider stool for Giardia is endoscopic evaluation is nondiagnostic Obtain colonoscopy report from [facility] or 2010 Ultrasound +/- HIDA is endoscopy is non-diagnostic  Please see the "Patient Instructions" section for addition details about the plan.  HPI: Sandra Ortiz is a 53 y.o. female referred for abdominal pain and rectal bleeding. She is a  [employer] [place]. The history is obtained through the patient.  She has a history of back pain, hypertension, multiple antibiotic allergies, and colon polyps.  She was evaluated by Dr. Fuller Plan in 2008.  She was most recently seen by a gastroenterologist in [place] in 2009 or 2010.  The history is obtained through the patient and review of her electronic health record.  Her current symptoms include:  (1) New onset non-odorous flatus in April 2020 No diarrhea.  No identified triggers.  No increased eructation.  No nocturnal symptoms.  No weight loss.  (2) Lower bilateral abdominal pain started soon after the flatus started in April 2020 Worsened by any eating - even with just grapes and applesauce yesterday Triggered by pasta, bread, and steak Associated with bloating and distention that occurs 30-60 minutes after eating Resolves within hours  Unable to identified relieving features Notes hypertensive episodes while having the pain Early satiety, nausea, poor appetite, unintentional weight gain over the last several years No diarrhea.  (3) Reflux x 2-3 weeks Originally awoke her from sleep Severe brash and burning No dysphagia or odynophagia  No vomiting, sore throat, neck pain, or dysphonia  (4) Rectal bleeding that started the weekend before Thanksgiving Occurs with every bowel movements for 4-5 days, then not present for several days Hemoglobin was normal.  No rectal pain No bleeding between bowel movements No foreign bodies or trauma No prior history of bleeding except when she had hemorrhoid bleeding while pregnancy in 1997.  She wonders if she has a food allergy.  Following lactose free diet and using lactaid without a change in her symptoms.   She had a normal EGD with Dr. Fuller Plan in 2008 to evaluate for abdominal pain. Colonoscopy in 2009 or 2010 for severe abdominal pain in [place].  She attributes the pain at that time during her divorce. She was told that she had a twist,  ulcer, and a few polyps. He treated her with Cipro, Flagyl, and Macrobid. She did no tolerate the treatment with a reaction.   No known family history of colon cancer or polyps. No family history of uterine/endometrial cancer, pancreatic cancer or gastric/stomach cancer.   Past Medical History:  Diagnosis Date  . Arthritis   . Colon ulcer   . Heart murmur   . Stomach ulcer   . Twisting of colon on long axis ([place])     No past surgical history on file.  Current Outpatient Medications  Medication Sig Dispense Refill  . famotidine (PEPCID) 10 MG tablet Take 10 mg by mouth daily.     No current facility-administered medications for this visit.    Allergies as of 09/24/2019 - Review Complete 09/24/2019  Allergen Reaction Noted  . Ciprofloxacin  11/14/2011  . Flagyl [metronidazole hcl]  11/14/2011  . Nitrofurantoin monohyd macro  11/14/2011  . Prednisone Other (See Comments) 08/29/2019  . Dooly [sulfur sublimed]  11/14/2011    Family History  Problem Relation Age of Onset  . Hypertension Father   . Hyperlipidemia Father   . Coronary artery disease Maternal Grandmother   . Coronary artery disease Maternal Aunt   . Emphysema Paternal Grandmother   . Cirrhosis Paternal Grandfather     Social History   Socioeconomic History  . Marital status: Single    Spouse name: Not on file  . Number of children: Not on file  . Years of education: Not on file  . Highest education level: Not on file  Occupational History  . Occupation: Kindergarten teaher  Tobacco Use  . Smoking status: Never Smoker  . Smokeless tobacco: Never Used  Substance and Sexual Activity  . Alcohol use: No  . Drug use: No  . Sexual activity: Not on file  Other Topics Concern  . Not on file  Social History Narrative  . Not on file   Social Determinants of Health   Financial Resource Strain:   . Difficulty of Paying Living Expenses: Not on file  Food Insecurity:   . Worried About Charity fundraiser  in the Last Year: Not on file  . Ran Out of Food in the Last Year: Not on file  Transportation Needs:   . Lack of Transportation (Medical): Not on file  . Lack of Transportation (Non-Medical): Not on file  Physical Activity:   . Days of Exercise per Week: Not on file  . Minutes of Exercise per Session: Not on file  Stress:   . Feeling of Stress : Not on file  Social Connections:   . Frequency of Communication with Friends and Family: Not on file  . Frequency of Social Gatherings with Friends and Family: Not on file  . Attends Religious Services: Not on file  . Active Member of Clubs or Organizations: Not on file  . Attends Archivist Meetings: Not on file  . Marital Status: Not on file  Intimate Partner Violence:   . Fear of Current or Ex-Partner: Not on file  . Emotionally Abused: Not on file  . Physically Abused: Not on file  . Sexually Abused: Not on file    Review of Systems: 12 system ROS is negative except as noted above with the additions of headaches, heart murmur, vertigo associated with pain and fluid in her ears, insomnia. Perimenopausal.   Physical Exam: General:  Alert,  well-nourished, pleasant and cooperative in NAD Head:  Normocephalic and atraumatic. Eyes:  Sclera clear, no icterus.   Conjunctiva pink. Ears:  Normal auditory acuity. Nose:  No deformity, discharge,  or lesions. Mouth:  No deformity or lesions.   Neck:  Supple; no masses or thyromegaly. Lungs:  Clear throughout to auscultation.   No wheezes. Heart:  Regular rate and rhythm; no murmurs. Abdomen:  Soft,nontender, nondistended, normal bowel sounds, no rebound or guarding. No hepatosplenomegaly.   Rectal:  Deferred  Msk:  Symmetrical. No boney deformities LAD: No inguinal or umbilical LAD Extremities:  No clubbing or edema. Neurologic:  Alert and  oriented x4;  grossly nonfocal Skin:  Intact without significant lesions or rashes. Psych:  Alert and cooperative. Normal mood and affect.     Sandra Ortiz L. Tarri Glenn, MD, MPH 09/24/2019, 10:29 AM

## 2019-09-24 NOTE — Patient Instructions (Signed)
Increase Famotidine to 20 mg twice a day  We have sent your demographic information and a prescription for Xifaxan to [facility]. This pharmacy is able to get medication approved through insurance and get you the lowest copay possible. If you have not heard from them within 1 week, please call our office at [phone] to let us know.  You have been scheduled for an endoscopy and colonoscopy. Please follow the written instructions given to you at your visit today. Please pick up your prep supplies at the pharmacy within the next 1-3 days. If you use inhalers (even only as needed), please bring them with you on the day of your procedure.

## 2019-09-25 LAB — SARS CORONAVIRUS 2 (TAT 6-24 HRS): SARS Coronavirus 2: NEGATIVE

## 2019-09-26 ENCOUNTER — Other Ambulatory Visit: Payer: Self-pay

## 2019-09-26 ENCOUNTER — Encounter: Payer: Self-pay | Admitting: Gastroenterology

## 2019-09-26 ENCOUNTER — Ambulatory Visit (AMBULATORY_SURGERY_CENTER): Payer: BC Managed Care – PPO | Admitting: Gastroenterology

## 2019-09-26 VITALS — BP 115/69 | HR 64 | Temp 98.7°F | Resp 16 | Ht 62.0 in | Wt 156.0 lb

## 2019-09-26 DIAGNOSIS — K648 Other hemorrhoids: Secondary | ICD-10-CM

## 2019-09-26 DIAGNOSIS — K625 Hemorrhage of anus and rectum: Secondary | ICD-10-CM | POA: Diagnosis present

## 2019-09-26 DIAGNOSIS — K317 Polyp of stomach and duodenum: Secondary | ICD-10-CM | POA: Diagnosis not present

## 2019-09-26 DIAGNOSIS — R1084 Generalized abdominal pain: Secondary | ICD-10-CM

## 2019-09-26 DIAGNOSIS — K2980 Duodenitis without bleeding: Secondary | ICD-10-CM

## 2019-09-26 DIAGNOSIS — K2990 Gastroduodenitis, unspecified, without bleeding: Secondary | ICD-10-CM

## 2019-09-26 DIAGNOSIS — D125 Benign neoplasm of sigmoid colon: Secondary | ICD-10-CM | POA: Diagnosis not present

## 2019-09-26 DIAGNOSIS — D124 Benign neoplasm of descending colon: Secondary | ICD-10-CM

## 2019-09-26 DIAGNOSIS — K2970 Gastritis, unspecified, without bleeding: Secondary | ICD-10-CM

## 2019-09-26 DIAGNOSIS — K3189 Other diseases of stomach and duodenum: Secondary | ICD-10-CM | POA: Diagnosis not present

## 2019-09-26 MED ORDER — SODIUM CHLORIDE 0.9 % IV SOLN: 500.0000 mL | Freq: Once | INTRAVENOUS | Status: DC

## 2019-09-26 NOTE — Op Note (Signed)
Sandra Ortiz Patient Name: Sandra Ortiz Procedure Date: 09/26/2019 8:19 AM MRN: [id number] Endoscopist: Thornton Park MD, MD Age: 53 Referring MD:  Date of Birth: [birth year] Gender: Female Account #: [id number] Procedure:                Colonoscopy Indications:              Rectal bleeding                           Abdominal pain with postprandial bloating, flatus,                            and distension                           Painless rectal bleeding                           History of colon polyps (patient report)                           Multiple antibiotic allergies                           Prior colonoscopy in 2009 or 2010 in [place]                           No known family history of colon cancer or polyps Medicines:                Monitored Anesthesia Care Procedure:                Pre-Anesthesia Assessment:                           - Prior to the procedure, a History and Physical                            was performed, and patient medications and                            allergies were reviewed. The patient's tolerance of                            previous anesthesia was also reviewed. The risks                            and benefits of the procedure and the sedation                            options and risks were discussed with the patient.                            All questions were answered, and informed consent                            was obtained. Prior Anticoagulants: The patient has  taken no previous anticoagulant or antiplatelet                            agents. ASA Grade Assessment: II - A patient with                            mild systemic disease. After reviewing the risks                            and benefits, the patient was deemed in                            satisfactory condition to undergo the procedure.                           After obtaining informed consent, the colonoscope                      was passed under direct vision. Throughout the                            procedure, the patient's blood pressure, pulse, and                            oxygen saturations were monitored continuously. The                            Colonoscope was introduced through the anus and                            advanced to the 5 cm into the ileum. A second                            forward view of the right colon was performed. The                            colonoscopy was performed without difficulty. The                            patient tolerated the procedure well. The quality                            of the bowel preparation was good. The terminal                            ileum, ileocecal valve, appendiceal orifice, and                            rectum were photographed. Scope In: 8:31:24 AM Scope Out: 8:47:31 AM Scope Withdrawal Time: 0 hours 13 minutes 46 seconds  Total Procedure Duration: 0 hours 16 minutes 7 seconds  Findings:                 Non-bleeding internal hemorrhoids were found. The  hemorrhoids were medium-sized.                           Anal papilla(e) were hypertrophied.                           Two sessile polyps were found in the sigmoid colon                            and descending colon. The polyps were 1 to 2 mm in                            size. These polyps were removed with a cold snare.                            Resection and retrieval were complete. Estimated                            blood loss was minimal.                           The terminal ileum appeared normal.                           The exam was otherwise without abnormality on                            direct and retroflexion views. Complications:            No immediate complications. Estimated blood loss:                            Minimal. Estimated Blood Loss:     Estimated blood loss was minimal. Impression:               -  Non-bleeding internal hemorrhoids.                           - Anal papilla(e) were hypertrophied.                           - Two 1 to 2 mm polyps in the sigmoid colon and in                            the descending colon, removed with a cold snare.                            Resected and retrieved.                           - The examination was otherwise normal on direct                            and retroflexion views. Recommendation:           - Patient has a contact number available for  emergencies. The signs and symptoms of potential                            delayed complications were discussed with the                            patient. Return to normal activities tomorrow.                            Written discharge instructions were provided to the                            patient.                           - Resume previous diet.                           - Continue present medications.                           - Await pathology results.                           - Repeat colonoscopy date to be determined after                            pending pathology results are reviewed for                            surveillance. Thornton Park MD, MD 09/26/2019 8:58:51 AM This report has been signed electronically.

## 2019-09-26 NOTE — Progress Notes (Signed)
To PACU, VSS. Report to [email or web address] 

## 2019-09-26 NOTE — Progress Notes (Signed)
Called to room to assist during endoscopic procedure.  Patient ID and intended procedure confirmed with present staff. Received instructions for my participation in the procedure from the performing physician.  

## 2019-09-26 NOTE — Progress Notes (Signed)
Temp  JB  VS  CW   Pt's states no medical or surgical changes since previsit or office visit.  Admitting RN reviewed   

## 2019-09-26 NOTE — Op Note (Signed)
[place] Patient Name: Sandra Ortiz Procedure Date: 09/26/2019 8:19 AM MRN: [id number] Endoscopist: Thornton Park MD, MD Age: 53 Referring MD:  Date of Birth: 1966/08/18 Gender: Female Account #: [id number] Procedure:                Upper GI endoscopy Indications:              Generalized abdominal pain, Abdominal bloating                           Abdominal pain with postprandial bloating, flatus,                            and distension                           New onset reflux Medicines:                Monitored Anesthesia Care Procedure:                Pre-Anesthesia Assessment:                           - Prior to the procedure, a History and Physical                            was performed, and patient medications and                            allergies were reviewed. The patient's tolerance of                            previous anesthesia was also reviewed. The risks                            and benefits of the procedure and the sedation                            options and risks were discussed with the patient.                            All questions were answered, and informed consent                            was obtained. Prior Anticoagulants: The patient has                            taken no previous anticoagulant or antiplatelet                            agents. ASA Grade Assessment: II - A patient with                            mild systemic disease. After reviewing the risks  and benefits, the patient was deemed in                            satisfactory condition to undergo the procedure.                           After obtaining informed consent, the endoscope was                            passed under direct vision. Throughout the                            procedure, the patient's blood pressure, pulse, and                            oxygen saturations were monitored continuously. The          Endoscope was introduced through the mouth, and                            advanced to the second part of duodenum. The upper                            GI endoscopy was accomplished without difficulty.                            The patient tolerated the procedure well. Scope In: Scope Out: Findings:                 The examined esophagus was normal. Biopsies were                            obtained from the proximal and distal esophagus                            with cold forceps for histology of suspected                            eosinophilic esophagitis.                           Diffuse minimal inflammation characterized by                            erythema, friability and granularity was found in                            the gastric body. Biopsies were taken with a cold                            forceps for histology. Estimated blood loss was                            minimal.  The examined duodenum was normal. Biopsies were                            taken with a cold forceps for histology.                           A small hiatal hernia was persent. The cardia and                            gastric fundus were normal on retroflexion.                           A few sessile polyps with no bleeding and no                            stigmata of recent bleeding were found in the                            gastric fundus. Biopsies were taken with a cold                            forceps for histology. Complications:            No immediate complications. Estimated blood loss:                            Minimal. Estimated Blood Loss:     Estimated blood loss was minimal. Impression:               - Normal esophagus. Biopsied.                           - Gastritis. Biopsied.                           - Normal examined duodenum. Biopsied.                           - The examination was otherwise normal. Recommendation:           - Patient has a  contact number available for                            emergencies. The signs and symptoms of potential                            delayed complications were discussed with the                            patient. Return to normal activities tomorrow.                            Written discharge instructions were provided to the                            patient.                           -  Resume previous diet.                           - Continue present medications.                           - Await pathology results.                           - Proceed with colonoscopy today as previously                            planned. Thornton Park MD, MD 09/26/2019 8:55:37 AM This report has been signed electronically.

## 2019-09-26 NOTE — Patient Instructions (Signed)
Information on gastritis,  hiatal hernias  and polyps given to you today.  Await pathology results.  YOU HAD AN ENDOSCOPIC PROCEDURE TODAY AT [facility]:   Refer to the procedure report that was given to you for any specific questions about what was found during the examination.  If the procedure report does not answer your questions, please call your gastroenterologist to clarify.  If you requested that your care partner not be given the details of your procedure findings, then the procedure report has been included in a sealed envelope for you to review at your convenience later.  YOU SHOULD EXPECT: Some feelings of bloating in the abdomen. Passage of more gas than usual.  Walking can help get rid of the air that was put into your GI tract during the procedure and reduce the bloating. If you had a lower endoscopy (such as a colonoscopy or flexible sigmoidoscopy) you may notice spotting of blood in your stool or on the toilet paper. If you underwent a bowel prep for your procedure, you may not have a normal bowel movement for a few days.  Please Note:  You might notice some irritation and congestion in your nose or some drainage.  This is from the oxygen used during your procedure.  There is no need for concern and it should clear up in a day or so.  SYMPTOMS TO REPORT IMMEDIATELY:   Following lower endoscopy (colonoscopy or flexible sigmoidoscopy):  Excessive amounts of blood in the stool  Significant tenderness or worsening of abdominal pains  Swelling of the abdomen that is new, acute  Fever of 100F or higher   Following upper endoscopy (EGD)  Vomiting of blood or coffee ground material  New chest pain or pain under the shoulder blades  Painful or persistently difficult swallowing  New shortness of breath  Fever of 100F or higher  Black, tarry-looking stools  For urgent or emergent issues, a gastroenterologist can be reached at any hour by calling [phone]  [phone].   DIET:  We do recommend a small meal at first, but then you may proceed to your regular diet.  Drink plenty of fluids but you should avoid alcoholic beverages for 24 hours.  ACTIVITY:  You should plan to take it easy for the rest of today and you should NOT DRIVE or use heavy machinery until tomorrow (because of the sedation medicines used during the test).    FOLLOW UP: Our staff will call the number listed on your records 48-72 hours following your procedure to check on you and address any questions or concerns that you may have regarding the information given to you following your procedure. If we do not reach you, we will leave a message.  We will attempt to reach you two times.  During this call, we will ask if you have developed any symptoms of COVID 19. If you develop any symptoms (ie: fever, flu-like symptoms, shortness of breath, cough etc.) before then, please call [phone].  If you test positive for Covid 19 in the 2 weeks post procedure, please call and report this information to [place].    If any biopsies were taken you will be contacted by phone or by letter within the next 1-3 weeks.  Please call us at [phone] if you have not heard about the biopsies in 3 weeks.    SIGNATURES/CONFIDENTIALITY: You and/or your care partner have signed paperwork which will be entered into your electronic medical record.  These signatures attest  to the fact that that the information above on your After Visit Summary has been reviewed and is understood.  Full responsibility of the confidentiality of this discharge information lies with you and/or your care-partner.

## 2019-09-30 ENCOUNTER — Telehealth: Payer: Self-pay

## 2019-09-30 NOTE — Telephone Encounter (Signed)
  Follow up Call-  Call back number 09/26/2019  Post procedure Call Back phone  # [phone]  Permission to leave phone message Yes  Some recent data might be hidden     Patient questions:  Do you have a fever, pain , or abdominal swelling? No. Pain Score  0 *  Have you tolerated food without any problems? Yes.    Have you been able to return to your normal activities? Yes.    Do you have any questions about your discharge instructions: Diet   No. Medications  No. Follow up visit  No.  Do you have questions or concerns about your Care? No.  Actions: * If pain score is 4 or above: No action needed, pain <4. 1. Have you developed a fever since your procedure? no  2.   Have you had an respiratory symptoms (SOB or cough) since your procedure? no  3.   Have you tested positive for COVID 19 since your procedure no  4.   Have you had any family members/close contacts diagnosed with the COVID 19 since your procedure?  no   If yes to any of these questions please route to Joylene John, RN and Alphonsa Gin, Therapist, sports.

## 2019-10-07 ENCOUNTER — Encounter: Payer: Self-pay | Admitting: *Deleted

## 2019-10-07 ENCOUNTER — Other Ambulatory Visit: Payer: Self-pay | Admitting: *Deleted

## 2019-10-07 DIAGNOSIS — R1084 Generalized abdominal pain: Secondary | ICD-10-CM

## 2019-10-07 DIAGNOSIS — K625 Hemorrhage of anus and rectum: Secondary | ICD-10-CM

## 2019-10-09 ENCOUNTER — Ambulatory Visit: Payer: BC Managed Care – PPO | Admitting: Gastroenterology

## 2019-10-31 ENCOUNTER — Other Ambulatory Visit: Payer: BC Managed Care – PPO

## 2019-10-31 DIAGNOSIS — K625 Hemorrhage of anus and rectum: Secondary | ICD-10-CM

## 2019-10-31 DIAGNOSIS — R1084 Generalized abdominal pain: Secondary | ICD-10-CM

## 2019-11-07 LAB — GASTROINTESTINAL PATHOGEN PANEL PCR
C. difficile Tox A/B, PCR: UNDETERMINED — AB
Campylobacter, PCR: UNDETERMINED — AB
Cryptosporidium, PCR: UNDETERMINED — AB
E coli (ETEC) LT/ST PCR: UNDETERMINED — AB
E coli (STEC) stx1/stx2, PCR: UNDETERMINED — AB
E coli 0157, PCR: UNDETERMINED — AB
Giardia lamblia, PCR: UNDETERMINED — AB
Norovirus, PCR: UNDETERMINED — AB
Rotavirus A, PCR: UNDETERMINED — AB
Salmonella, PCR: UNDETERMINED — AB
Shigella, PCR: UNDETERMINED — AB

## 2019-11-08 ENCOUNTER — Other Ambulatory Visit: Payer: Self-pay | Admitting: *Deleted

## 2019-11-08 DIAGNOSIS — K625 Hemorrhage of anus and rectum: Secondary | ICD-10-CM

## 2019-11-12 ENCOUNTER — Encounter: Payer: Self-pay | Admitting: Gastroenterology

## 2019-11-12 ENCOUNTER — Other Ambulatory Visit: Payer: BC Managed Care – PPO

## 2019-11-12 ENCOUNTER — Ambulatory Visit: Payer: BC Managed Care – PPO | Admitting: Gastroenterology

## 2019-11-12 VITALS — BP 110/80 | HR 80 | Temp 97.4°F | Ht 62.0 in | Wt 160.0 lb

## 2019-11-12 DIAGNOSIS — R140 Abdominal distension (gaseous): Secondary | ICD-10-CM | POA: Diagnosis not present

## 2019-11-12 DIAGNOSIS — R1084 Generalized abdominal pain: Secondary | ICD-10-CM | POA: Diagnosis not present

## 2019-11-12 DIAGNOSIS — K625 Hemorrhage of anus and rectum: Secondary | ICD-10-CM | POA: Diagnosis not present

## 2019-11-12 DIAGNOSIS — K219 Gastro-esophageal reflux disease without esophagitis: Secondary | ICD-10-CM | POA: Diagnosis not present

## 2019-11-12 MED ORDER — RIFAXIMIN 550 MG PO TABS: 550.0000 mg | ORAL_TABLET | Freq: Three times a day (TID) | ORAL | 0 refills | Status: DC

## 2019-11-12 NOTE — Progress Notes (Signed)
Referring Provider: Loman Brooklyn, FNP Primary Care Physician:  Loman Brooklyn, FNP  Chief complaint:  Abdominal pain, flatulence, rectal bleeding during bowel movements, acid reflux, chest pain   IMPRESSION:  Abdominal pain with postprandial bloating, flatus, and distension Painless rectal bleeding, likely due to hemorrhoids Reflux improving on famotidine Multiple antibiotic allergies History of colon polyp    - prior history of polyp (path unknown, patient report)    - tubular adenoma on colonoscopy 09/26/19    - surveillance colonoscopy due 2027    - Prior colonoscopy in 2009 or 2010 in [place] No known family history of colon cancer or polyps  Ongoing abdominal pain with associated increased flatulence and postprandial bloating: Suspected small bowel bloater by symptoms. Duodenal biopsies negative for celiac. Her response to Xifaxan makes bacterial overgrowth likely. Must also consider giardia, symptomatic gallbladder disease or dietary factors such as intolerance to lactulose, fructose, or sorbitol.  Differential also includes IBS, gastroesophageal reflux, gastroparesis, carbohydrate malabsorption, celiac, and functional dyspepsia.   Repeat 14 days of Xifaxan.  Send stool for Giardia. Proceed with evaluation for symptomatic gallbladder disease.   Reviewed pathology results from recent colonoscopy. Given the tubular adenoma, she should have another colonoscopy in 7 years.    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  HPI: Sandra Ortiz is a 54 y.o. female under evaluation for abdominal pain and rectal bleeding. 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 Seen in consultation 09/24/19. EGD and colonoscopy performed 09/26/19. She returns in scheduled follow-up. The interval history is obtained through the patient.  She has a history of back pain, hypertension, multiple antibiotic allergies, and colon polyps.  She was evaluated by Dr. Fuller Plan in 2008.  She was seen by a gastroenterologist in [place] in 2009 or 2010.    Her recent symptoms include:  (1) Non-odorous flatus with associated bloating that started in April 2020.  No diarrhea or constipation. No identified triggers. Less bloating while on the Xifaxan. Continued to have abdominal pain while on Xifaxan.  Had the most normal stools while on Xifaxan with big, large, soft bowel movement.   (2) Lower bilateral abdominal pain started soon after the flatus started in April 2020 Worsened by eating, especially triggered by pasta, bread, and steak. Recent severe episode after eating brisket. Had another severe attack completely unrelated to eating.  Associated with bloating and distention that occurs 30-60 minutes after eating Resolves within hours  Unable to identified relieving features Notes hypertensive episodes while having the pain Intermittent early satiety, nausea, poor appetite, unintentional weight gain over the last several years  (3) Reflux  Originally awoke her from sleep Severe brash and burning No dysphagia or odynophagia  No vomiting, sore throat, neck pain, or dysphonia  (4) Intermittent rectal bleeding that started the weekend before Thanksgiving No associated rectal pain.  No bleeding between bowel movements No further bleeding after the colonoscopy  Following lactose free diet and using lactaid without a change in her symptoms.   She had a normal EGD with Dr. Fuller Plan in 2008 to evaluate for abdominal pain. Colonoscopy in 2009 or 2010 for severe abdominal pain in [place].  She attributes the pain at that time during her divorce.  She was told that she had a twist,  ulcer, and a few polyps. He treated her with Cipro, Flagyl, and Macrobid. She did no tolerate the treatment with a reaction.    Recent Endoscopy: Colonoscopy 09/26/19: internal hemorrhoids, hypertrophied anal papillae, two left sided polyps- one was a tubular adenoma, the other a colonic fold EGD 09/26/19: small hiatal hernia, peptic duodenitis, reactive gastropathy without H pylori, fundic gland polyps, reactive squamous esophageal mucosa  Mom and maternal aunts with gallbladder disease, all of whom think her current symptoms may be due to her gallbladder.    Past Medical History:  Diagnosis Date  . Arthritis   . Colon ulcer   . Heart murmur   . Stomach ulcer   . Twisting of colon on long axis [facility])     Past Surgical History:  Procedure Laterality Date  . COLONOSCOPY    . UPPER GASTROINTESTINAL ENDOSCOPY      Current Outpatient Medications  Medication Sig Dispense Refill  . famotidine (PEPCID) 20 MG tablet Take 1 tablet (20 mg total) by mouth 2 (two) times daily. 60 tablet 3   No current facility-administered medications for this visit.    Allergies as of 11/12/2019 - Review Complete 09/26/2019  Allergen Reaction Noted  . Ciprofloxacin  11/14/2011  . Flagyl [metronidazole hcl]  11/14/2011  . Nitrofurantoin monohyd macro  11/14/2011  . Prednisone Other (See Comments) 08/29/2019  . [place] [sulfur sublimed]  11/14/2011    Family History  Problem Relation Age of Onset  . Hypertension Father   . Hyperlipidemia Father   . Coronary artery disease Maternal Grandmother   . Coronary artery disease Maternal Aunt   . Emphysema Paternal Grandmother   . Cirrhosis Paternal Grandfather   . Colon cancer Neg Hx   . Esophageal cancer Neg Hx   . Rectal cancer Neg Hx   . Stomach cancer Neg Hx     Social History   Socioeconomic History  . Marital status: Single    Spouse name: Not on file  . Number of children: Not on file  . Years of education: Not on file  . Highest  education level: Not on file  Occupational History  . Occupation: Kindergarten teaher  Tobacco Use  . Smoking status: Never Smoker  . Smokeless tobacco: Never Used  Substance and Sexual Activity  . Alcohol use: No  . Drug use: No  . Sexual activity: Not on file  Other Topics Concern  . Not on file  Social History Narrative  . Not on file   Social Determinants of Health   Financial Resource Strain:   . Difficulty of Paying Living Expenses: Not on file  Food Insecurity:   . Worried About Charity fundraiser in the Last Year: Not on file  . Ran Out of Food in the Last Year: Not on file  Transportation Needs:   . Lack of Transportation (Medical): Not on file  . Lack of Transportation (Non-Medical): Not on file  Physical Activity:   . Days of Exercise per Week: Not on file  . Minutes of Exercise per Session: Not on file  Stress:   . Feeling of Stress : Not on file  Social Connections:   . Frequency of Communication with Friends and Family: Not on file  . Frequency of Social Gatherings with Friends and Family: Not on file  . Attends Religious Services: Not on file  . Active Member of Clubs or Organizations: Not on file  . Attends Archivist  Meetings: Not on file  . Marital Status: Not on file  Intimate Partner Violence:   . Fear of Current or Ex-Partner: Not on file  . Emotionally Abused: Not on file  . Physically Abused: Not on file  . Sexually Abused: Not on file    Review of Systems: 12 system ROS is negative except as noted above with the additions of headaches, heart murmur, vertigo associated with pain and fluid in her ears, insomnia. Perimenopausal.   Physical Exam: General:   Alert,  well-nourished, pleasant and cooperative in NAD Head:  Normocephalic and atraumatic. Eyes:  Sclera clear, no icterus.   Conjunctiva pink. Ears:  Normal auditory acuity. Nose:  No deformity, discharge,  or lesions. Mouth:  No deformity or lesions.   Neck:  Supple; no  masses or thyromegaly. Lungs:  Clear throughout to auscultation.   No wheezes. Heart:  Regular rate and rhythm; no murmurs. Abdomen:  Soft,nontender, nondistended, normal bowel sounds, no rebound or guarding. No hepatosplenomegaly.   Rectal:  Deferred  Msk:  Symmetrical. No boney deformities LAD: No inguinal or umbilical LAD Extremities:  No clubbing or edema. Neurologic:  Alert and  oriented x4;  grossly nonfocal Skin:  Intact without significant lesions or rashes. Psych:  Alert and cooperative. Normal mood and affect.    Cimone Fahey L. Tarri Glenn, MD, MPH 11/12/2019, 3:32 PM

## 2019-11-12 NOTE — Patient Instructions (Addendum)
Increase Famotidine to 20 mg twice a day   We have sent your demographic information and a prescription for Xifaxan to [facility]. This pharmacy is able to get medication approved through insurance and get you the lowest copay possible. If you have not heard from them within 1 week, please call our office at [phone] to let us know.  Your provider has requested that you go to the basement level for lab work before leaving today. Press "B" on the elevator. The lab is located at the first door on the left as you exit the elevator.  You have been scheduled for an abdominal ultrasound at [facility] (1st floor of hospital) on 11-15-19 at 10:00 am. Please arrive 15 minutes prior to your appointment for registration. Make certain not to have anything to eat or drink 6 hours prior to your appointment. Should you need to reschedule your appointment, please contact radiology at [phone]. This test typically takes about 30 minutes to perform.  I value your feedback and thank you for entrusting [place] with your care. If you get a [facility] patient survey, I would appreciate you taking the time to let us know about your experience today. Thank you!   Due to recent changes in healthcare laws, you may see the results of your imaging and laboratory studies on MyChart before your provider has had a chance to review them.  We understand that in some cases there may be results that are confusing or concerning to you. Not all laboratory results come back in the same time frame and the provider may be waiting for multiple results in order to interpret others.  Please give [place] 48 hours in order for your provider to thoroughly review all the results before contacting the office for clarification of your results.

## 2019-11-15 ENCOUNTER — Ambulatory Visit (HOSPITAL_COMMUNITY)
Admission: RE | Admit: 2019-11-15 | Discharge: 2019-11-15 | Disposition: A | Discharge status: Home / Self Care | Payer: BC Managed Care – PPO | Source: Ambulatory Visit | Attending: Gastroenterology | Admitting: Gastroenterology

## 2019-11-15 ENCOUNTER — Other Ambulatory Visit: Payer: BC Managed Care – PPO

## 2019-11-15 ENCOUNTER — Other Ambulatory Visit: Payer: Self-pay

## 2019-11-15 ENCOUNTER — Other Ambulatory Visit: Payer: Self-pay | Admitting: *Deleted

## 2019-11-15 DIAGNOSIS — R1084 Generalized abdominal pain: Secondary | ICD-10-CM

## 2019-11-15 DIAGNOSIS — K625 Hemorrhage of anus and rectum: Secondary | ICD-10-CM

## 2019-11-15 DIAGNOSIS — R140 Abdominal distension (gaseous): Secondary | ICD-10-CM | POA: Insufficient documentation

## 2019-11-15 DIAGNOSIS — K219 Gastro-esophageal reflux disease without esophagitis: Secondary | ICD-10-CM | POA: Insufficient documentation

## 2019-11-18 ENCOUNTER — Encounter: Payer: Self-pay | Admitting: *Deleted

## 2019-11-18 ENCOUNTER — Ambulatory Visit (HOSPITAL_COMMUNITY): Payer: BC Managed Care – PPO

## 2019-11-20 LAB — GASTROINTESTINAL PATHOGEN PANEL PCR
C. difficile Tox A/B, PCR: NOT DETECTED
Campylobacter, PCR: NOT DETECTED
Cryptosporidium, PCR: NOT DETECTED
E coli (ETEC) LT/ST PCR: NOT DETECTED
E coli (STEC) stx1/stx2, PCR: NOT DETECTED
E coli 0157, PCR: NOT DETECTED
Giardia lamblia, PCR: NOT DETECTED
Norovirus, PCR: NOT DETECTED
Rotavirus A, PCR: NOT DETECTED
Salmonella, PCR: NOT DETECTED
Shigella, PCR: NOT DETECTED

## 2019-11-20 LAB — GIARDIA ANTIGEN
MICRO NUMBER:: 10121315
RESULT:: NOT DETECTED
SPECIMEN QUALITY:: ADEQUATE

## 2019-11-29 ENCOUNTER — Ambulatory Visit: Payer: BC Managed Care – PPO | Admitting: Family Medicine

## 2019-12-03 ENCOUNTER — Encounter: Payer: Self-pay | Admitting: *Deleted

## 2019-12-03 ENCOUNTER — Other Ambulatory Visit: Payer: Self-pay

## 2019-12-03 ENCOUNTER — Encounter (HOSPITAL_COMMUNITY)
Admission: RE | Admit: 2019-12-03 | Discharge: 2019-12-03 | Disposition: A | Discharge status: Home / Self Care | Payer: BC Managed Care – PPO | Source: Ambulatory Visit | Attending: Gastroenterology | Admitting: Gastroenterology

## 2019-12-03 DIAGNOSIS — R1084 Generalized abdominal pain: Secondary | ICD-10-CM | POA: Diagnosis not present

## 2019-12-03 MED ORDER — TECHNETIUM TC 99M MEBROFENIN IV KIT
5.3000 | PACK | Freq: Once | INTRAVENOUS | Status: AC | PRN
  Administered 2019-12-03: 08:00:00 5.3 via INTRAVENOUS

## 2019-12-09 ENCOUNTER — Telehealth: Payer: Self-pay | Admitting: Gastroenterology

## 2019-12-09 NOTE — Telephone Encounter (Signed)
Patient complaining of continued abd pain. Reports the pain starts in her neck and "it feels like something is squeezing my neck." Patient frustrated all tests have been negative. She wanted to be scheduled to come back in to speak with Dr. Tarri Glenn. Patient scheduled for 3/23 at 1:50 pm.

## 2019-12-09 NOTE — Telephone Encounter (Signed)
Patient is having a lot of pain requesting to speak with you

## 2019-12-09 NOTE — Telephone Encounter (Signed)
I am sorry that she continues to have abdominal pain. I recommend a trial of FDGard used every day between now and her appointment with me later this month. Thank you.

## 2019-12-10 NOTE — Telephone Encounter (Signed)
Detailed message left for the patient on her VM.

## 2019-12-31 ENCOUNTER — Ambulatory Visit: Payer: BC Managed Care – PPO | Admitting: Gastroenterology

## 2020-01-15 ENCOUNTER — Other Ambulatory Visit: Payer: Self-pay | Admitting: Gastroenterology

## 2020-05-28 ENCOUNTER — Other Ambulatory Visit: Payer: Self-pay | Admitting: Gastroenterology

## 2020-06-25 ENCOUNTER — Ambulatory Visit (HOSPITAL_COMMUNITY)
Admission: RE | Admit: 2020-06-25 | Discharge: 2020-06-25 | Disposition: A | Discharge status: Home / Self Care | Payer: BC Managed Care – PPO | Source: Ambulatory Visit | Attending: Nurse Practitioner | Admitting: Nurse Practitioner

## 2020-06-25 ENCOUNTER — Other Ambulatory Visit: Payer: Self-pay

## 2020-06-25 ENCOUNTER — Other Ambulatory Visit: Payer: Self-pay | Admitting: Nurse Practitioner

## 2020-06-25 ENCOUNTER — Ambulatory Visit: Payer: BC Managed Care – PPO | Admitting: Nurse Practitioner

## 2020-06-25 ENCOUNTER — Encounter: Payer: Self-pay | Admitting: Nurse Practitioner

## 2020-06-25 VITALS — BP 122/81 | HR 80 | Temp 97.1°F | Ht 62.0 in | Wt 158.0 lb

## 2020-06-25 DIAGNOSIS — R1031 Right lower quadrant pain: Secondary | ICD-10-CM

## 2020-06-25 DIAGNOSIS — R1030 Lower abdominal pain, unspecified: Secondary | ICD-10-CM

## 2020-06-25 MED ORDER — IOHEXOL 300 MG/ML  SOLN
100.0000 mL | Freq: Once | INTRAMUSCULAR | Status: AC | PRN
  Administered 2020-06-25: 100 mL via INTRAVENOUS

## 2020-06-25 MED ORDER — POLYETHYLENE GLYCOL 3350 17 GM/SCOOP PO POWD: 17.0000 g | Freq: Two times a day (BID) | ORAL | 1 refills | Status: AC | PRN

## 2020-06-25 NOTE — Patient Instructions (Signed)

## 2020-06-25 NOTE — Progress Notes (Signed)
Acute Office Visit  Subjective:    Patient ID: Sandra Ortiz, female    DOB: Jun 07, 1966, 54 y.o.   MRN: [id number]  Chief Complaint  Patient presents with  . Abdominal Pain    right lower quadrant    Abdominal Pain This is a new problem. The current episode started in the past 7 days. The onset quality is gradual. The problem occurs intermittently. The problem has been unchanged. The pain is located in the RLQ. The pain is at a severity of 5/10. The pain is moderate. The quality of the pain is dull and aching. Associated symptoms include diarrhea and nausea. Associated symptoms comments: Blurred vision, clammy skin, shaky. Nothing aggravates the pain. The pain is relieved by nothing. She has tried nothing for the symptoms.    Past Medical History:  Diagnosis Date  . Arthritis   . Colon ulcer   . GERD (gastroesophageal reflux disease)   . Heart murmur   . Hiatal hernia   . Stomach ulcer   . Twisting of colon on long axis [facility])     Past Surgical History:  Procedure Laterality Date  . COLONOSCOPY    . UPPER GASTROINTESTINAL ENDOSCOPY      Family History  Problem Relation Age of Onset  . Hypertension Father   . Hyperlipidemia Father   . Coronary artery disease Maternal Grandmother   . Coronary artery disease Maternal Aunt   . Emphysema Paternal Grandmother   . Cirrhosis Paternal Grandfather   . Colon cancer Neg Hx   . Esophageal cancer Neg Hx   . Rectal cancer Neg Hx   . Stomach cancer Neg Hx     Social History   Socioeconomic History  . Marital status: Single    Spouse name: Not on file  . Number of children: Not on file  . Years of education: Not on file  . Highest education level: Not on file  Occupational History  . Occupation: Kindergarten teaher  Tobacco Use  . Smoking status: Never Smoker  . Smokeless tobacco: Never Used  Vaping Use  . Vaping Use: Never used  Substance and Sexual Activity  . Alcohol use: No  . Drug use: No  . Sexual activity:  Not on file  Other Topics Concern  . Not on file  Social History Narrative  . Not on file   Social Determinants of Health   Financial Resource Strain:   . Difficulty of Paying Living Expenses: Not on file  Food Insecurity:   . Worried About Charity fundraiser in the Last Year: Not on file  . Ran Out of Food in the Last Year: Not on file  Transportation Needs:   . Lack of Transportation (Medical): Not on file  . Lack of Transportation (Non-Medical): Not on file  Physical Activity:   . Days of Exercise per Week: Not on file  . Minutes of Exercise per Session: Not on file  Stress:   . Feeling of Stress : Not on file  Social Connections:   . Frequency of Communication with Friends and Family: Not on file  . Frequency of Social Gatherings with Friends and Family: Not on file  . Attends Religious Services: Not on file  . Active Member of Clubs or Organizations: Not on file  . Attends Archivist Meetings: Not on file  . Marital Status: Not on file  Intimate Partner Violence:   . Fear of Current or Ex-Partner: Not on file  . Emotionally Abused: Not  on file  . Physically Abused: Not on file  . Sexually Abused: Not on file    Outpatient Medications Prior to Visit  Medication Sig Dispense Refill  . [place] Oil-Levomenthol (FDGARD) 25-20.75 MG CAPS Take by mouth in the morning and at bedtime.    . famotidine (PEPCID) 20 MG tablet TAKE 1 TABLET(20 MG) BY MOUTH TWICE DAILY 60 tablet 3  . rifaximin (XIFAXAN) 550 MG TABS tablet Take 1 tablet (550 mg total) by mouth 3 (three) times daily. 42 tablet 0   No facility-administered medications prior to visit.    Allergies  Allergen Reactions  . Ciprofloxacin   . Flagyl [Metronidazole Hcl]   . Nitrofurantoin Monohyd Macro   . Prednisone Other (See Comments)    Abdominal pain.  Melida Gimenez [Sulfur Sublimed]     Review of Systems  Gastrointestinal: Positive for abdominal pain, diarrhea and nausea.  Genitourinary: Negative for  flank pain and pelvic pain.  Skin: Negative for color change and rash.  All other systems reviewed and are negative.      Objective:    Physical Exam Vitals reviewed.  Constitutional:      Appearance: Normal appearance. She is well-developed.  HENT:     Head: Normocephalic.  Cardiovascular:     Rate and Rhythm: Normal rate and regular rhythm.     Heart sounds: Normal heart sounds.  Pulmonary:     Effort: Pulmonary effort is normal.     Breath sounds: Normal breath sounds.  Abdominal:     General: Abdomen is flat. Bowel sounds are normal. There is no distension.     Palpations: Abdomen is soft.     Tenderness: There is abdominal tenderness in the right lower quadrant. There is no right CVA tenderness, left CVA tenderness or rebound.  Musculoskeletal:        General: Tenderness present.     Cervical back: Normal range of motion.  Skin:    General: Skin is warm.  Neurological:     Mental Status: She is alert and oriented to person, place, and time.  Psychiatric:        Mood and Affect: Mood normal.        Behavior: Behavior normal.     BP 122/81   Pulse 80   Temp (!) 97.1 F (36.2 C)   Ht 5\' 2"  (1.575 m)   Wt 158 lb (71.7 kg)   SpO2 97%   BMI 28.90 kg/m  Wt Readings from Last 3 Encounters:  06/25/20 158 lb (71.7 kg)  11/12/19 160 lb (72.6 kg)  09/26/19 156 lb (70.8 kg)    Health Maintenance Due  Topic Date Due  . Hepatitis C Screening  Never done  . INFLUENZA VACCINE  Never done    There are no preventive care reminders to display for this patient.    Lab Results  Component Value Date   WBC 7.3 10/24/2014   HGB 13.2 10/24/2014   HCT 41.2 10/24/2014   MCV 88.5 10/24/2014   Lab Results  Component Value Date   NA 138 10/24/2014   K 4.3 10/24/2014   CO2 25 10/24/2014   GLUCOSE 96 10/24/2014   BUN 14 10/24/2014   CREATININE 0.66 10/24/2014   BILITOT 0.4 10/24/2014   ALKPHOS 69 10/24/2014   AST 18 10/24/2014   ALT 16 10/24/2014   PROT 7.3  10/24/2014   ALBUMIN 4.7 10/24/2014   CALCIUM 9.4 10/24/2014       Assessment & Plan:  Lower abdominal pain  Patient is a 54 year old female who presents to clinic with right lower quadrant abdominal pain in the last 7 days.  Patient reports that right lower quadrant pain has been associated with clammy skin, diarrhea, blurry vision, nausea without vomiting, shakiness and lightheadedness.  Patient had a COVID-19 test conducted few days prior and results are negative.  This is new for patient and symptoms are not well managed or resolving.  Patient is walking with guarding and was at school when she had an episode of diarrhea.  Patient denies changing any life routine, going to the restaurant or eating anything new.  On assessment patient is negative for rebound tenderness.  Patient has no fever or chills.  Patient reports the pain is a 5/10 out of a  pain scale of 0/10. Provided education to patient with printed handouts given. Labs collected today CBC, CMP. CT abdomen ordered right lower quadrant to rule out appendicitis.  Problem List Items Addressed This Visit      Other   Lower abdominal pain - Primary   Relevant Orders   CBC with Differential   Comprehensive metabolic panel    Other Visit Diagnoses    Right lower quadrant abdominal pain       Relevant Orders   CT Abdomen Pelvis W Contrast        Ivy Lynn, NP

## 2020-06-25 NOTE — Progress Notes (Signed)
Attempted to call CT scan report to [place]. The only phone number provided was to the office.

## 2020-06-25 NOTE — Assessment & Plan Note (Signed)
Patient is a 54 year old female who presents to clinic with right lower quadrant abdominal pain in the last 7 days.  Patient reports that right lower quadrant pain has been associated with clammy skin, diarrhea, blurry vision, nausea without vomiting, shakiness and lightheadedness.  Patient had a COVID-19 test conducted few days prior and results are negative.  This is new for patient and symptoms are not well managed or resolving.  Patient is walking with guarding and was at school when she had an episode of diarrhea.  Patient denies changing any life routine, going to the restaurant or eating anything new.  On assessment patient is negative for rebound tenderness.  Patient has no fever or chills.  Patient reports the pain is a 5/10 out of a  pain scale of 0/10. Provided education to patient with printed handouts given. Labs collected today CBC, CMP. CT abdomen ordered right lower quadrant to rule out appendicitis.

## 2020-06-26 ENCOUNTER — Telehealth: Payer: Self-pay | Admitting: Family Medicine

## 2020-06-26 LAB — COMPREHENSIVE METABOLIC PANEL
ALT: 16 IU/L (ref 0–32)
AST: 14 IU/L (ref 0–40)
Albumin/Globulin Ratio: 1.8 (ref 1.2–2.2)
Albumin: 4.2 g/dL (ref 3.8–4.9)
Alkaline Phosphatase: 91 IU/L (ref 44–121)
BUN/Creatinine Ratio: 13 (ref 9–23)
BUN: 10 mg/dL (ref 6–24)
Bilirubin Total: 0.3 mg/dL (ref 0.0–1.2)
CO2: 28 mmol/L (ref 20–29)
Calcium: 9.1 mg/dL (ref 8.7–10.2)
Chloride: 103 mmol/L (ref 96–106)
Creatinine, Ser: 0.75 mg/dL (ref 0.57–1.00)
GFR calc Af Amer: 107 mL/min/{1.73_m2} (ref >59)
GFR calc non Af Amer: 93 mL/min/{1.73_m2} (ref >59)
Globulin, Total: 2.4 g/dL (ref 1.5–4.5)
Glucose: 80 mg/dL (ref 65–99)
Potassium: 4.4 mmol/L (ref 3.5–5.2)
Sodium: 143 mmol/L (ref 134–144)
Total Protein: 6.6 g/dL (ref 6.0–8.5)

## 2020-06-26 LAB — CBC WITH DIFFERENTIAL/PLATELET
Basophils Absolute: 0 10*3/uL (ref 0.0–0.2)
Basos: 0 %
EOS (ABSOLUTE): 0.1 10*3/uL (ref 0.0–0.4)
Eos: 1 %
Hematocrit: 39.8 % (ref 34.0–46.6)
Hemoglobin: 13.2 g/dL (ref 11.1–15.9)
Immature Grans (Abs): 0 10*3/uL (ref 0.0–0.1)
Immature Granulocytes: 0 %
Lymphocytes Absolute: 2.3 10*3/uL (ref 0.7–3.1)
Lymphs: 27 %
MCH: 29.9 pg (ref 26.6–33.0)
MCHC: 33.2 g/dL (ref 31.5–35.7)
MCV: 90 fL (ref 79–97)
Monocytes Absolute: 0.8 10*3/uL (ref 0.1–0.9)
Monocytes: 10 %
Neutrophils Absolute: 5.3 10*3/uL (ref 1.4–7.0)
Neutrophils: 62 %
Platelets: 306 10*3/uL (ref 150–450)
RBC: 4.41 x10E6/uL (ref 3.77–5.28)
RDW: 12.8 % (ref 11.7–15.4)
WBC: 8.6 10*3/uL (ref 3.4–10.8)

## 2020-06-26 NOTE — Telephone Encounter (Signed)
Patient aware and verbalized understanding. °

## 2020-09-10 IMAGING — NM NM HEPATO W/GB/PHARM/[PERSON_NAME]
2 series · 12 of 12 positions shown · non-contrast
Comparison: None

CLINICAL DATA: Generalized abdominal pain, nausea, vomiting and
abdominal swelling after meals, fatty food intolerance, symptoms off
and on since [place] [id number]

EXAM:
NUCLEAR MEDICINE HEPATOBILIARY IMAGING WITH GALLBLADDER EF
TECHNIQUE: Sequential images of the abdomen were obtained [DATE] minutes
following intravenous administration of radiopharmaceutical. After
oral ingestion of Ensure, gallbladder ejection fraction was
determined. At 60 min, normal ejection fraction is greater than 33%.
RADIOPHARMACEUTICALS:  5.3 mCi Kc-OOm  Choletec IV

[Series 1: biliary · 3.25mm/px · 6 of 60 frames shown]
[frame 6/60]
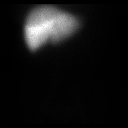
[frame 16/60]
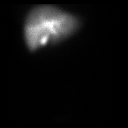
[frame 26/60]
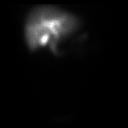
[frame 36/60]
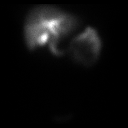
[frame 46/60]
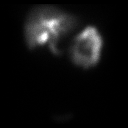
[frame 56/60]
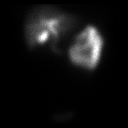

[Series 2: gbef · 3.25mm/px · 6 of 60 frames shown]
[frame 6/60]
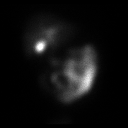
[frame 16/60]
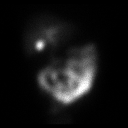
[frame 26/60]
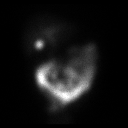
[frame 36/60]
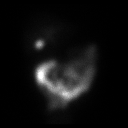
[frame 46/60]
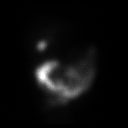
[frame 56/60]
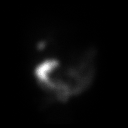

[12 of 12 positions shown; findings below may reference images not displayed]

FINDINGS: Normal tracer extraction from bloodstream indicating normal
hepatocellular function.

Normal excretion of tracer into biliary tree.

Gallbladder visualized at 14 min.

Small bowel visualized at 22 min.

No hepatic retention of tracer.

Subjectively normal emptying of tracer from gallbladder following
fatty meal stimulation.

Calculated gallbladder ejection fraction is 75%, normal.

Patient reported mild pain following Ensure ingestion.

Normal gallbladder ejection fraction following Ensure ingestion is
greater than 33% at 1 hour.
IMPRESSION: Patent biliary tree with normal gallbladder ejection fraction of 75%
following fatty meal stimulation.

Patient reported mild pain following Ensure ingestion.

## 2020-09-23 ENCOUNTER — Other Ambulatory Visit: Payer: Self-pay | Admitting: Gastroenterology

## 2021-01-28 ENCOUNTER — Other Ambulatory Visit: Payer: Self-pay | Admitting: Gastroenterology

## 2021-04-06 ENCOUNTER — Other Ambulatory Visit: Payer: Self-pay | Admitting: Gastroenterology

## 2023-02-24 ENCOUNTER — Telehealth: Payer: Self-pay | Admitting: Gastroenterology

## 2023-02-24 NOTE — Telephone Encounter (Signed)
The pt was last seen in 2021 by Dr Orvan Falconer.  She states she recently was seen in the ED for abd pain.  She was told that she should follow up with GI. She has an appt for first available on 7/31 with [facility]. She has been advised to keep that appt and if her pain returns she and is severe she should go to the ED.  She also has been advised that she could also call her PCP in the meantime.  The pt has been advised of the information and verbalized understanding.

## 2023-02-24 NOTE — Telephone Encounter (Signed)
Patient called stating that she has had severe pain in the middle of her stomach below her esophagus. She has ben vomiting due to the pain. She was sent to the ED yesterday because of how much pain she was in. She is requesting a call back to discuss if something could be done before her appointment on 07/31 because she believes it is getting worse. Please advise, thank you.

## 2023-02-24 NOTE — Telephone Encounter (Signed)
Left message on machine to call back  

## 2023-05-10 ENCOUNTER — Ambulatory Visit: Payer: Self-pay | Admitting: Nurse Practitioner

## 2023-06-28 NOTE — Progress Notes (Unsigned)
06/29/2023 Sandra Ortiz [id number] March 20, 1966  Referring provider: No ref. provider found Primary GI doctor:Dr. Leonides Schanz (Dr. Orvan Falconer)  ASSESSMENT AND PLAN:   Abdominal pain, epigastric with LPR and globus sensation Lifestyle changes discussed, avoid NSAIDS, ETOH Increase pepcid to 40 mg at night and 20 in the morning, after EGD can consider PPI Will schedule for EGD  I discussed risks of EGD with patient today, including risk of sedation, bleeding or perforation.  Patient provides understanding and gave verbal consent to proceed. Consider repeat HIDA or referral to surgery with history of HIDA with pain.  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 female with a past medical history of hiatal hernia, GERD, gastric ulcer, colon ulcer and others listed below presents for evaluation of epigastric pain.   Colonoscopy 09/26/19: internal hemorrhoids, hypertrophied anal papillae, two left sided polyps- one was a tubular adenoma, the other a colonic fold Due 2027 EGD 09/26/19: small hiatal hernia, peptic duodenitis, reactive gastropathy without H pylori, fundic gland polyps, reactive squamous esophageal mucosa 11/12/2019 last seen Dr. Orvan Falconer for abdominal pain, flatulence, rectal bleeding acid reflux and chest pain, placed on Xifaxan empirically for SIBO as she had a good response prior, ultrasound for possible gallbladder disease with strong family history plus or minus HIDA.  Patient had negative Giardia antigen.  11/15/2019 abdominal ultrasound showed subcentimeter hyperechoic focus right lobe liver nonspecific likely reflect small  hemangioma HIDA showed patent biliary tree, normal ejection fraction 75% did report mild pain following ensure ingestion 06/25/2020 CT abdomen pelvis for light lower quadrant abdominal pain showed unremarkable liver, normal gallbladder, pancreas, spleen.  Normal stomach and small bowel.  Did show moderate to large amount of right colonic stool burden.  Otherwise unremarkable  02/12/2023 positive strep a 02/23/2023 patient went to [facility] for acute abdominal pain. CT and pelvis with contrast showed small hiatal hernia, no wall thickening or bowel dilation large stool burden with fecalized loops of distal ileum 02/23/2023 for right upper quadrant ultrasound showed gallbladder wall cholesterolosis and 1 cm hyperechoic right liver lesion most likely presented in hemangioma.  Nonobstructing right renal calculus. Labs showed no anemia, normal kidney and liver, lipase 9  She takes pepcid 20 mg twice a day.  She states if she misses her dose she will have GERD.  She states for the last month she has had feeling of burning in her pharynx, sensation of something there.  She went to the ER 05/16 for epigastric AB pain that was sudden, no associated symptoms. Morphine helped pain.  CT showed small hiatal hernia so she was referred here. Has had epigastric pain wake her up 3 x at night, and has had light discomfort during the day. 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  She will have a larger lunch.  Can last for a few hours and then will go away on its own.  She has had some intermittent dysphagia, can feel with apple sauce even.  She states for her whole life, had days without BM. She went dairy free and started to have Bm's daily  but this changed in May.  She can now have a few days without BM, she does miralax at night 1 capful.   She has been purposely been losing weight. 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   She denies blood thinner use.  She denies NSAID use.  She denies ETOH use.   She denies tobacco use.  She denies drug use.    She  reports that she has never smoked. She has never used smokeless tobacco. She reports that she does not drink alcohol and does not use drugs.  RELEVANT LABS AND IMAGING: CBC    Component Value Date/Time   WBC 8.6 06/25/2020 1527   WBC 7.3 10/24/2014 1238   RBC 4.41 06/25/2020 1527   RBC 4.7 10/24/2014 1238   HGB 13.2 06/25/2020 1527   HCT 39.8 06/25/2020 1527   PLT 306 06/25/2020 1527   MCV 90 06/25/2020 1527   MCH 29.9 06/25/2020 1527   MCH 28.4 10/24/2014 1238   MCHC 33.2 06/25/2020 1527   MCHC 32.1 10/24/2014 1238   RDW 12.8 06/25/2020 1527   LYMPHSABS 2.3 06/25/2020 1527   EOSABS 0.1 06/25/2020 1527   BASOSABS 0.0 06/25/2020 1527   No results for input(s): "HGB" in the last 8760 hours.  CMP     Component Value Date/Time   NA 143 06/25/2020 1527   K 4.4 06/25/2020 1527   CL 103 06/25/2020 1527   CO2 28 06/25/2020 1527   GLUCOSE 80 06/25/2020 1527   GLUCOSE 92 11/29/2006 1628   BUN 10 06/25/2020 1527   CREATININE 0.75 06/25/2020 1527   CALCIUM 9.1 06/25/2020 1527   PROT 6.6 06/25/2020 1527   ALBUMIN 4.2 06/25/2020 1527   AST 14 06/25/2020 1527   ALT 16 06/25/2020 1527   ALKPHOS 91 06/25/2020 1527   BILITOT 0.3 06/25/2020 1527   GFRNONAA 93 06/25/2020 1527   GFRAA 107 06/25/2020 1527      Latest Ref Rng & Units 06/25/2020    3:27 PM 10/24/2014   12:26 PM  Hepatic Function  Total Protein 6.0 - 8.5 g/dL 6.6  7.3   Albumin 3.8 - 4.9 g/dL 4.2  4.7   AST 0 - 40 IU/L 14  18   ALT 0 - 32 IU/L 16  16   Alk Phosphatase 44 - 121 IU/L 91  69   Total Bilirubin 0.0 - 1.2 mg/dL 0.3  0.4       Current Medications:        Current Outpatient Medications (Other):    famotidine (PEPCID) 20 MG tablet, TAKE 1 TABLET(20 MG) BY MOUTH TWICE DAILY   hyoscyamine (LEVSIN SL) 0.125 MG SL tablet, Place 1  tablet (0.125 mg total) under the tongue every 6 (six) hours as needed for cramping (nausea, diarrhea).   lactobacillus acidophilus (BACID) TABS tablet, Take 2 tablets by mouth 3 (three) times daily.   Multiple Vitamin (MULTIVITAMIN) tablet, Take 1 tablet by mouth daily.   polyethylene glycol powder (GLYCOLAX/MIRALAX) 17 GM/SCOOP powder, Take 17 g by mouth 2 (two) times daily as needed. Daily for 7 days. Then stop   Caraway Oil-Levomenthol (FDGARD) 25-20.75 MG CAPS, Take by mouth in the morning and at bedtime. (Patient not taking: Reported on 06/29/2023)  Medical History:  Past Medical History:  Diagnosis Date   Arthritis    Colon ulcer    GERD (gastroesophageal reflux disease)    Heart murmur  Hiatal hernia    Stomach ulcer    Twisting of colon on long axis (HCC)    Allergies:  Allergies  Allergen Reactions   Ciprofloxacin    Flagyl [Metronidazole Hcl]    Nitrofurantoin Monohyd Macro    Prednisone Other (See Comments)    Abdominal pain.   Sulphur [Sulfur Sublimed]      Surgical History:  She  has a past surgical history that includes Colonoscopy and Upper gastrointestinal endoscopy. Family History:  Her family history includes Cirrhosis in her paternal grandfather; Coronary artery disease in her maternal aunt and maternal grandmother; Emphysema in her paternal grandmother; Hyperlipidemia in her father; Hypertension in her father.  REVIEW OF SYSTEMS  : All other systems reviewed and negative except where noted in the History of Present Illness.  PHYSICAL EXAM: BP 122/70   Pulse 75   Ht 5\' 2"  (1.575 m)   Wt 149 lb (67.6 kg)   BMI 27.25 kg/m  General Appearance: Well nourished, in no apparent distress. Head:   Normocephalic and atraumatic. Eyes:  sclerae anicteric,conjunctive pink  Respiratory: Respiratory effort normal, BS equal bilaterally without rales, rhonchi, wheezing. Cardio: RRR with no MRGs. Peripheral pulses intact.  Abdomen: Soft,  Non-distended ,active bowel  sounds. mild tenderness in the LLQ and moderate epigastricWithout guarding and Without rebound. No masses. Rectal: Not evaluated Musculoskeletal: Full ROM, Normal gait. Without edema. Skin:  Dry and intact without significant lesions or rashes Neuro: Alert and  oriented x4;  No focal deficits. Psych:  Cooperative. Normal mood and affect.    Doree Albee, PA-C 9:01 AM

## 2023-06-29 ENCOUNTER — Encounter: Payer: Self-pay | Admitting: Physician Assistant

## 2023-06-29 ENCOUNTER — Ambulatory Visit: Payer: BC Managed Care – PPO | Admitting: Physician Assistant

## 2023-06-29 VITALS — BP 122/70 | HR 75 | Ht 62.0 in | Wt 149.0 lb

## 2023-06-29 DIAGNOSIS — R09A2 Foreign body sensation, throat: Secondary | ICD-10-CM

## 2023-06-29 DIAGNOSIS — R1013 Epigastric pain: Secondary | ICD-10-CM | POA: Diagnosis not present

## 2023-06-29 DIAGNOSIS — K581 Irritable bowel syndrome with constipation: Secondary | ICD-10-CM

## 2023-06-29 DIAGNOSIS — K219 Gastro-esophageal reflux disease without esophagitis: Secondary | ICD-10-CM

## 2023-06-29 MED ORDER — HYOSCYAMINE SULFATE 0.125 MG SL SUBL: 0.1250 mg | SUBLINGUAL_TABLET | Freq: Four times a day (QID) | SUBLINGUAL | 1 refills | Status: DC | PRN

## 2023-06-29 NOTE — Patient Instructions (Addendum)
We have given you samples of the following medication to take: Linzess FD guard  Miralax is an osmotic laxative.  It only brings more water into the stool.  This is safe to take daily.  Can take up to 17 gram of miralax twice a day.  Mix with juice or coffee.  Start 1 capful at night for 3-4 days and reassess your response in 3-4 days.  You can increase and decrease the dose based on your response.  Remember, it can take up to 3-4 days to take effect OR for the effects to wear off.   I often pair this with benefiber in the morning to help assure the stool is not too loose.    Linzess 145 mcg *IBS-C patients may begin to experience relief from belly pain and overall abdominal symptoms (pain, discomfort, and bloating) in about 1 week,  with symptoms typically improving over 12 weeks.  Take at least 30 minutes before the first meal of the day on an empty stomach You can have a loose stool if you eat a high-fat breakfast. Give it at least 7 days, may have more bowel movements during that time.   The diarrhea should go away and you should start having normal, complete, full bowel movements.  It may be helpful to start treatment when you can be near the comfort of your own bathroom, such as a weekend.  After you are out we can send in a prescription if you did well, there is a prescription card   Toileting tips to help with your constipation - Drink at least 64-80 ounces of water/liquid per day. - Establish a time to try to move your bowels every day.  For many people, this is after a cup of coffee or after a meal such as breakfast. - Sit all of the way back on the toilet keeping your back fairly straight and while sitting up, try to rest the tops of your forearms on your upper thighs.   - Raising your feet with a step stool/squatty potty can be helpful to improve the angle that allows your stool to pass through the rectum. - Relax the rectum feeling it bulge toward the toilet water.  If  you feel your rectum raising toward your body, you are contracting rather than relaxing. - Breathe in and slowly exhale. "Belly breath" by expanding your belly towards your belly button. Keep belly expanded as you gently direct pressure down and back to the anus.  A low pitched GRRR sound can assist with increasing intra-abdominal pressure.  (Can also trying to blow on a pinwheel and make it move, this helps with the same belly breathing) - Repeat 3-4 times. If unsuccessful, contract the pelvic floor to restore normal tone and get off the toilet.  Avoid excessive straining. - To reduce excessive wiping by teaching your anus to normally contract, place hands on outer aspect of knees and resist knee movement outward.  Hold 5-10 second then place hands just inside of knees and resist inward movement of knees.  Hold 5 seconds.  Repeat a few times each way.  Go to the ER if unable to pass gas, severe AB pain, unable to hold down food, any shortness of breath of chest pain.  Take 2 of the pepcid at night and 1 in the morning Avoid spicy and acidic foods Avoid fatty foods Limit your intake of coffee, tea, alcohol, and carbonated drinks Work to maintain a healthy weight Keep the head of the bed  elevated at least 3 inches with blocks or a wedge pillow if you are having any nighttime symptoms Stay upright for 2 hours after eating Avoid meals and snacks three to four hours before bedtime  Can do trial of FDGARD which is over the counter for AB pain- Take 1-2 capsules once a day for maintence or twice a day during a flare Can send in an anti spasm medication, Levsin, to take as needed     FODMAP stands for fermentable oligo-, di-, mono-saccharides and polyols (1). These are the scientific terms used to classify groups of carbs that are difficult for our body to digest and that are notorious for triggering digestive symptoms like bloating, gas, loose stools and stomach pain.   You can try low FODMAP diet   - start with eliminating just one column at a time that you feel may be a trigger for you. - the table at the very bottom contains foods that are low in FODMAPs   Sometimes trying to eliminate the FODMAP's from your diet is difficult or tricky, if you are stuggling with trying to do the elimination diet you can try an enzyme.  There is a food enzymes that you sprinkle in or on your food that helps break down the FODMAP. You can read more about the enzyme by going to this site: https://fodzyme.com/  _______________________________________________________  If your blood pressure at your visit was 140/90 or greater, please contact your primary care physician to follow up on this.  _______________________________________________________  If you are age 57 or older, your body mass index should be between 23-30. Your Body mass index is 27.25 kg/m. If this is out of the aforementioned range listed, please consider follow up with your Primary Care Provider.  If you are age 14 or younger, your body mass index should be between 19-25. Your Body mass index is 27.25 kg/m. If this is out of the aformentioned range listed, please consider follow up with your Primary Care Provider.   ________________________________________________________  The [place] [facility] providers would like to encourage you to use [facility] to communicate with providers for non-urgent requests or questions.  Due to long hold times on the telephone, sending your provider a message by [facility] may be a faster and more efficient way to get a response.  Please allow 48 business hours for a response.  Please remember that this is for non-urgent requests.  _______________________________________________________ It was a pleasure to see you today!  Thank you for trusting me with your gastrointestinal care!

## 2023-06-29 NOTE — Progress Notes (Signed)
I agree with the assessment and plan as outlined by Sandra Ortiz.

## 2023-07-07 ENCOUNTER — Encounter: Payer: Self-pay | Admitting: Internal Medicine

## 2023-07-20 ENCOUNTER — Ambulatory Visit: Payer: BC Managed Care – PPO | Admitting: Internal Medicine

## 2023-07-20 ENCOUNTER — Encounter: Payer: Self-pay | Admitting: Internal Medicine

## 2023-07-20 VITALS — BP 116/76 | HR 75 | Temp 98.4°F | Resp 21 | Ht 62.0 in | Wt 149.0 lb

## 2023-07-20 DIAGNOSIS — R1013 Epigastric pain: Secondary | ICD-10-CM

## 2023-07-20 DIAGNOSIS — K2980 Duodenitis without bleeding: Secondary | ICD-10-CM

## 2023-07-20 DIAGNOSIS — K6389 Other specified diseases of intestine: Secondary | ICD-10-CM

## 2023-07-20 DIAGNOSIS — K317 Polyp of stomach and duodenum: Secondary | ICD-10-CM

## 2023-07-20 DIAGNOSIS — K449 Diaphragmatic hernia without obstruction or gangrene: Secondary | ICD-10-CM

## 2023-07-20 MED ORDER — SODIUM CHLORIDE 0.9 % IV SOLN: 500.0000 mL | Freq: Once | INTRAVENOUS | Status: DC

## 2023-07-20 MED ORDER — OMEPRAZOLE 40 MG PO CPDR
40.0000 mg | DELAYED_RELEASE_CAPSULE | Freq: Every day | ORAL | 60 refills | Status: DC
Start: 2023-07-20 — End: 2023-09-12

## 2023-07-20 NOTE — Op Note (Signed)
[facility] Patient Name: Sandra Ortiz Procedure Date: 07/20/2023 2:11 PM MRN: [id number] Endoscopist: Madelyn Brunner [place] , , [id number] Age: 57 Referring MD:  Date of Birth: 01-31-1966 Gender: Female Account #: [id number] Procedure:                Upper GI endoscopy Indications:              Epigastric abdominal pain, Heartburn Medicines:                Monitored Anesthesia Care Procedure:                Pre-Anesthesia Assessment:                           - Prior to the procedure, a History and Physical                            was performed, and patient medications and                            allergies were reviewed. The patient's tolerance of                            previous anesthesia was also reviewed. The risks                            and benefits of the procedure and the sedation                            options and risks were discussed with the patient.                            All questions were answered, and informed consent                            was obtained. Prior Anticoagulants: The patient has                            taken no anticoagulant or antiplatelet agents. ASA                            Grade Assessment: II - A patient with mild systemic                            disease. After reviewing the risks and benefits,                            the patient was deemed in satisfactory condition to                            undergo the procedure.                           After obtaining informed consent, the endoscope was  passed under direct vision. Throughout the                            procedure, the patient's blood pressure, pulse, and                            oxygen saturations were monitored continuously. The                            GIF [id number] #[id number] was introduced through the                            mouth, and advanced to the second part of duodenum.                            The upper  GI endoscopy was accomplished without                            difficulty. The patient tolerated the procedure                            well. Scope In: Scope Out: Findings:                 The examined esophagus was normal.                           A hiatal hernia was present.                           Localized mildly erythematous mucosa without                            bleeding was found in the gastric antrum. Biopsies                            were taken with a cold forceps for Helicobacter                            pylori testing.                           Localized mild inflammation characterized by                            congestion (edema), erosions and erythema was found                            in the duodenal bulb.                           Three 1 to 2 mm sessile polyps with no bleeding                            were found in the duodenal bulb and in the second  portion of the duodenum. These polyps were removed                            with a cold biopsy forceps. Resection and retrieval                            were complete. Complications:            No immediate complications. Estimated Blood Loss:     Estimated blood loss was minimal. Impression:               - Normal esophagus.                           - Hiatal hernia.                           - Erythematous mucosa in the antrum. Biopsied.                           - Duodenitis.                           - Three duodenal polyps. Resected and retrieved. Recommendation:           - Discharge patient to home (with escort).                           - Await pathology results.                           - Use Prilosec (omeprazole) 40 mg PO daily.                           - Return to GI clinic in 2 months.                           - The findings and recommendations were discussed                            with the patient. Dr Particia Lather "[name]" Leonides Schanz,  07/20/2023  2:42:37 PM

## 2023-07-20 NOTE — Patient Instructions (Addendum)
Recommendation:           - Discharge patient to home (with escort).                           - Await pathology results.                           - Use Prilosec (omeprazole) 40 mg PO daily.                           - Return to GI clinic in 2 months.                           - The findings and recommendations were discussed                            with the patient.  YOU HAD AN ENDOSCOPIC PROCEDURE TODAY AT THE [facility]:   Refer to the procedure report that was given to you for any specific questions about what was found during the examination.  If the procedure report does not answer your questions, please call your gastroenterologist to clarify.  If you requested that your care partner not be given the details of your procedure findings, then the procedure report has been included in a sealed envelope for you to review at your convenience later.  YOU SHOULD EXPECT: Some feelings of bloating in the abdomen. Passage of more gas than usual.  Walking can help get rid of the air that was put into your GI tract during the procedure and reduce the bloating. If you had a lower endoscopy (such as a colonoscopy or flexible sigmoidoscopy) you may notice spotting of blood in your stool or on the toilet paper. If you underwent a bowel prep for your procedure, you may not have a normal bowel movement for a few days.  Please Note:  You might notice some irritation and congestion in your nose or some drainage.  This is from the oxygen used during your procedure.  There is no need for concern and it should clear up in a day or so.  SYMPTOMS TO REPORT IMMEDIATELY: Following upper endoscopy (EGD)  Vomiting of blood or coffee ground material  New chest pain or pain under the shoulder blades  Painful or persistently difficult swallowing  New shortness of breath  Fever of 100F or higher  Black, tarry-looking stools  For urgent or emergent issues, a gastroenterologist can be reached at any  hour by calling [phone]. Do not use MyChart messaging for urgent concerns.    DIET:  We do recommend a small meal at first, but then you may proceed to your regular diet.  Drink plenty of fluids but you should avoid alcoholic beverages for 24 hours.  ACTIVITY:  You should plan to take it easy for the rest of today and you should NOT DRIVE or use heavy machinery until tomorrow (because of the sedation medicines used during the test).    FOLLOW UP: Our staff will call the number listed on your records the next business day following your procedure.  We will call around 7:15- 8:00 am to check on you and address any questions or concerns that you may have regarding the information given to you following your procedure. If we do  not reach you, we will leave a message.     If any biopsies were taken you will be contacted by phone or by letter within the next 1-3 weeks.  Please call us at [phone] if you have not heard about the biopsies in 3 weeks.    SIGNATURES/CONFIDENTIALITY: You and/or your care partner have signed paperwork which will be entered into your electronic medical record.  These signatures attest to the fact that that the information above on your After Visit Summary has been reviewed and is understood.  Full responsibility of the confidentiality of this discharge information lies with you and/or your care-partner.

## 2023-07-20 NOTE — Progress Notes (Signed)
Uneventful anesthetic. Report to pacu rn. Vss. Care resumed by rn. 

## 2023-07-20 NOTE — Progress Notes (Signed)
GASTROENTEROLOGY PROCEDURE H&P NOTE   Primary Care Physician: Westley Chandler, MD    Reason for Procedure:   Epigastric ab pain, GERD  Plan:    EGD   Patient is appropriate for endoscopic procedure(s) in the ambulatory ([facility]) setting.  The nature of the procedure, as well as the risks, benefits, and alternatives were carefully and thoroughly reviewed with the patient. Ample time for discussion and questions allowed. The patient understood, was satisfied, and agreed to proceed.     HPI: Sandra Ortiz is a 57 y.o. female who presents for EGD for evaluation of epigastric ab pain, GERD.  Patient was most recently seen in the Gastroenterology Clinic on 06/29/23.  No interval change in medical history since that appointment. Please refer to that note for full details regarding GI history and clinical presentation.   Past Medical History:  Diagnosis Date   Arthritis    Colon ulcer    GERD (gastroesophageal reflux disease)    Heart murmur    Hiatal hernia    Stomach ulcer    Twisting of colon on long axis (HCC)     Past Surgical History:  Procedure Laterality Date   COLONOSCOPY     UPPER GASTROINTESTINAL ENDOSCOPY      Prior to Admission medications   Medication Sig Start Date End Date Taking? Authorizing Provider  Caraway Oil-Levomenthol (FDGARD) 25-20.75 MG CAPS Take by mouth in the morning and at bedtime.   Yes [provider]  famotidine (PEPCID) 20 MG tablet TAKE 1 TABLET(20 MG) BY MOUTH TWICE DAILY 01/28/21  Yes Tressia Danas, MD  lactobacillus acidophilus (BACID) TABS tablet Take 2 tablets by mouth 3 (three) times daily.   Yes [provider]  polyethylene glycol powder (GLYCOLAX/MIRALAX) 17 GM/SCOOP powder Take 17 g by mouth 2 (two) times daily as needed. Daily for 7 days. Then stop 06/25/20  Yes Daryll Drown, NP  hyoscyamine (LEVSIN SL) 0.125 MG SL tablet Place 1 tablet (0.125 mg total) under the tongue every 6 (six) hours as needed for cramping  (nausea, diarrhea). Patient not taking: Reported on 07/20/2023 06/29/23   Doree Albee, PA-C  Multiple Vitamin (MULTIVITAMIN) tablet Take 1 tablet by mouth daily. Patient not taking: Reported on 07/20/2023    [provider]    Current Outpatient Medications  Medication Sig Dispense Refill   Caraway Oil-Levomenthol (FDGARD) 25-20.75 MG CAPS Take by mouth in the morning and at bedtime.     famotidine (PEPCID) 20 MG tablet TAKE 1 TABLET(20 MG) BY MOUTH TWICE DAILY 30 tablet 0   lactobacillus acidophilus (BACID) TABS tablet Take 2 tablets by mouth 3 (three) times daily.     polyethylene glycol powder (GLYCOLAX/MIRALAX) 17 GM/SCOOP powder Take 17 g by mouth 2 (two) times daily as needed. Daily for 7 days. Then stop 3350 g 1   hyoscyamine (LEVSIN SL) 0.125 MG SL tablet Place 1 tablet (0.125 mg total) under the tongue every 6 (six) hours as needed for cramping (nausea, diarrhea). (Patient not taking: Reported on 07/20/2023) 20 tablet 1   Multiple Vitamin (MULTIVITAMIN) tablet Take 1 tablet by mouth daily. (Patient not taking: Reported on 07/20/2023)     Current Facility-Administered Medications  Medication Dose Route Frequency Provider Last Rate Last Admin   0.9 %  sodium chloride infusion  500 mL Intravenous Once Imogene Burn, MD        Allergies as of 07/20/2023 - Review Complete 07/20/2023  Allergen Reaction Noted   Ciprofloxacin  11/14/2011  Flagyl [metronidazole hcl]  11/14/2011   Nitrofurantoin monohyd macro  11/14/2011   Prednisone Other (See Comments) 08/29/2019   Sulphur [sulfur sublimed]  11/14/2011   Penicillin g Hives and Rash 02/26/2023   Penicillins Rash 07/20/2023    Family History  Problem Relation Age of Onset   Hypertension Father    Hyperlipidemia Father    Coronary artery disease Maternal Grandmother    Coronary artery disease Maternal Aunt    Emphysema Paternal Grandmother    Cirrhosis Paternal Grandfather    Colon cancer Neg Hx    Esophageal  cancer Neg Hx    Rectal cancer Neg Hx    Stomach cancer Neg Hx     Social History   Socioeconomic History   Marital status: Single    Spouse name: Not on file   Number of children: 2   Years of education: Not on file   Highest education level: Not on file  Occupational History   Occupation: Kindergarten teaher   Occupation: Runner, broadcasting/film/video  Tobacco Use   Smoking status: Never   Smokeless tobacco: Never  Vaping Use   Vaping status: Never Used  Substance and Sexual Activity   Alcohol use: No   Drug use: No   Sexual activity: Not on file  Other Topics Concern   Not on file  Social History Narrative   Not on file   Social Determinants of Health   Financial Resource Strain: Not on file  Food Insecurity: Not on file  Transportation Needs: Not on file  Physical Activity: Not on file  Stress: Not on file  Social Connections: Unknown (08/17/2022)   Received [facility], [facility]   Social Network    Social Network: Not on file  Intimate Partner Violence: Unknown (08/17/2022)   Received from [employer], [facility]   HITS    Physically Hurt: Not on file    Insult or Talk Down To: Not on file    Threaten Physical Harm: Not on file    Scream or Curse: Not on file    Physical Exam: Vital signs in last 24 hours: BP 133/73   Pulse 62   Temp 98.4 F (36.9 C)   Ht 5\' 2"  (1.575 m)   Wt 149 lb (67.6 kg)   SpO2 100%   BMI 27.25 kg/m  GEN: NAD EYE: Sclerae anicteric ENT: MMM CV: Non-tachycardic Pulm: No increased WOB GI: Soft NEURO:  Alert & Oriented   Eulah Pont, MD [facility]   07/20/2023 2:06 PM

## 2023-07-20 NOTE — Progress Notes (Signed)
Called to room to assist during endoscopic procedure.  Patient ID and intended procedure confirmed with present staff. Received instructions for my participation in the procedure from the performing physician.  

## 2023-07-21 ENCOUNTER — Telehealth: Payer: Self-pay

## 2023-07-21 NOTE — Telephone Encounter (Signed)
  Follow up Call-     07/20/2023    1:46 PM  Call back number  Post procedure Call Back phone  # [phone]  Permission to leave phone message Yes     Patient questions:  Do you have a fever, pain , or abdominal swelling? No. Pain Score  0 *  Have you tolerated food without any problems? Yes.    Have you been able to return to your normal activities? Yes.    Do you have any questions about your discharge instructions: Diet   No. Medications  No. Follow up visit  No.  Do you have questions or concerns about your Care? No.  Actions: * If pain score is 4 or above: No action needed, pain <4.

## 2023-07-27 ENCOUNTER — Encounter: Payer: Self-pay | Admitting: Internal Medicine

## 2023-07-27 LAB — SURGICAL PATHOLOGY

## 2023-09-12 ENCOUNTER — Encounter: Payer: Self-pay | Admitting: Internal Medicine

## 2023-09-12 ENCOUNTER — Ambulatory Visit: Payer: BC Managed Care – PPO | Admitting: Internal Medicine

## 2023-09-12 VITALS — BP 110/80 | HR 97 | Ht 62.0 in | Wt 144.0 lb

## 2023-09-12 DIAGNOSIS — K581 Irritable bowel syndrome with constipation: Secondary | ICD-10-CM

## 2023-09-12 DIAGNOSIS — K449 Diaphragmatic hernia without obstruction or gangrene: Secondary | ICD-10-CM

## 2023-09-12 DIAGNOSIS — K219 Gastro-esophageal reflux disease without esophagitis: Secondary | ICD-10-CM

## 2023-09-12 DIAGNOSIS — R09A2 Foreign body sensation, throat: Secondary | ICD-10-CM

## 2023-09-12 DIAGNOSIS — R1013 Epigastric pain: Secondary | ICD-10-CM

## 2023-09-12 MED ORDER — OMEPRAZOLE 40 MG PO CPDR: 40.0000 mg | DELAYED_RELEASE_CAPSULE | Freq: Every day | ORAL | 6 refills | Status: DC

## 2023-09-12 MED ORDER — HYOSCYAMINE SULFATE 0.125 MG SL SUBL: 0.1250 mg | SUBLINGUAL_TABLET | Freq: Four times a day (QID) | SUBLINGUAL | 1 refills | Status: AC | PRN

## 2023-09-12 MED ORDER — FAMOTIDINE 20 MG PO TABS: 20.0000 mg | ORAL_TABLET | Freq: Every day | ORAL | 6 refills | Status: AC

## 2023-09-12 NOTE — Patient Instructions (Addendum)
We have sent the following medications to your pharmacy for you to pick up at your convenience: Pepcid, Levsin, Omeprazole  Follow up 6 months  If your blood pressure at your visit was 140/90 or greater, please contact your primary care physician to follow up on this.  _______________________________________________________  If you are age 55 or older, your body mass index should be between 23-30. Your Body mass index is 26.34 kg/m. If this is out of the aforementioned range listed, please consider follow up with your Primary Care Provider.  If you are age 55 or younger, your body mass index should be between 19-25. Your Body mass index is 26.34 kg/m. If this is out of the aformentioned range listed, please consider follow up with your Primary Care Provider.   ________________________________________________________  The  GI providers would like to encourage you to use [facility] to communicate with providers for non-urgent requests or questions.  Due to long hold times on the telephone, sending your provider a message by [facility] may be a faster and more efficient way to get a response.  Please allow 48 business hours for a response.  Please remember that this is for non-urgent requests.  _______________________________________________________     Thank you for entrusting me with your care and for choosing [facility],  Dr. Eulah Pont

## 2023-09-12 NOTE — Progress Notes (Signed)
09/12/2023 Sandra Ortiz [id number] 08/25/1966  Referring provider: Westley Chandler, MD Primary GI doctor:Dr. Leonides Schanz  ASSESSMENT AND PLAN:   Epigastric ab pain - improved Globus - resolved GERD - resolved Constipation-improved IBS-improved Patient has had significant improvement in her abdominal pain.  She has also had resolution in her acid reflux and globus sensation on Prilosec therapy.  Abdominal pain may be due to a combination of GERD, IBS, and constipation - Cont Prilosec 40 every day. Refill.  Patient would like to stay on the 40 mg daily dosing for now.  Could consider decreasing this dosage in the future - Cont Pepcid 20 mg every day - Cont FDGard QD - Continue Levsin as needed   - Previously was given info on low FODMAP diet - Continue Miralax - RTC in 6 months   Patient Care Team: Westley Chandler, MD as PCP - General (Family Medicine)  HISTORY OF PRESENT ILLNESS: 57 y.o. female with a past medical history of hiatal hernia, GERD, gastric ulcer, colon ulcer presents for follow-up of epigastric pain.   Patient is a first grade teacher  Colonoscopy 09/26/19: internal hemorrhoids, hypertrophied anal papillae, two left sided polyps- one was a tubular adenoma, the other a colonic fold Due 2027 EGD 09/26/19: small hiatal hernia, peptic duodenitis, reactive gastropathy without H pylori, fundic gland polyps, reactive squamous esophageal mucosa 11/12/2019 last seen Dr. Orvan Falconer for abdominal pain, flatulence, rectal bleeding acid reflux and chest pain, placed on Xifaxan empirically for SIBO as she had a good response prior, ultrasound for possible gallbladder disease with strong family history plus or minus HIDA.  Patient had negative Giardia antigen.  11/15/2019 abdominal ultrasound showed subcentimeter hyperechoic focus right lobe liver nonspecific likely reflect small hemangioma HIDA showed patent biliary tree, normal ejection fraction 75% did report mild pain following  ensure ingestion 06/25/2020 CT abdomen pelvis for light lower quadrant abdominal pain showed unremarkable liver, normal gallbladder, pancreas, spleen.  Normal stomach and small bowel.  Did show moderate to large amount of right colonic stool burden.  Otherwise unremarkable  02/12/2023 positive strep a 02/23/2023 patient went to [facility] for acute abdominal pain. CT and pelvis with contrast showed small hiatal hernia, no wall thickening or bowel dilation large stool burden with fecalized loops of distal ileum 02/23/2023 for right upper quadrant ultrasound showed gallbladder wall cholesterolosis and 1 cm hyperechoic right liver lesion most likely presented in hemangioma.  Nonobstructing right renal calculus. Labs showed no anemia, normal kidney and liver, lipase 9  EGD 07/20/23: - Normal esophagus. - Hiatal hernia. - Erythematous mucosa in the antrum. Biopsied. - Duodenitis. - Three duodenal polyps. Resected and retrieved. Path: 1. Surgical [P], duodenal polyps, polyp (3) :       - POLYPOID FRAGMENTS OF DUODENAL MUCOSA WITH REACTIVE CHANGES       - NEGATIVE FOR DYSPLASIA AND MALIGNANCY       2. Surgical [P], gastric antrum and gastric body :       - GASTRIC ANTRAL AND OXYNTIC MUCOSA WITH NO SPECIFIC PATHOLOGIC CHANGES       - NEGATIVE FOR H. PYLORI ON H&E STAIN       - NEGATIVE FOR INTESTINAL METAPLASIA OR MALIGNANCY   Interval History: Ab pain has gotten a lot better. She is now experiencing an episode of abdominal pain every 2-3 weeks. FDGard, Prilosec, and increased doses of miralax seemed to help with her ab pain. Levsin PRN has also helped with her ab pain. Sometimes she poops  a couple of timed per day. Globus and burning in the back her throat have resolved. She didn't have to start the Linzess because the Miralax was enough to induce BMs. She is eating lighter. She has lost some weight, but she is overall happy with this weight loss  RELEVANT LABS AND IMAGING: CBC    Component Value Date/Time    WBC 8.6 06/25/2020 1527   WBC 7.3 10/24/2014 1238   RBC 4.41 06/25/2020 1527   RBC 4.7 10/24/2014 1238   HGB 13.2 06/25/2020 1527   HCT 39.8 06/25/2020 1527   PLT 306 06/25/2020 1527   MCV 90 06/25/2020 1527   MCH 29.9 06/25/2020 1527   MCH 28.4 10/24/2014 1238   MCHC 33.2 06/25/2020 1527   MCHC 32.1 10/24/2014 1238   RDW 12.8 06/25/2020 1527   LYMPHSABS 2.3 06/25/2020 1527   EOSABS 0.1 06/25/2020 1527   BASOSABS 0.0 06/25/2020 1527   No results for input(s): "HGB" in the last 8760 hours.  CMP     Component Value Date/Time   NA 143 06/25/2020 1527   K 4.4 06/25/2020 1527   CL 103 06/25/2020 1527   CO2 28 06/25/2020 1527   GLUCOSE 80 06/25/2020 1527   GLUCOSE 92 11/29/2006 1628   BUN 10 06/25/2020 1527   CREATININE 0.75 06/25/2020 1527   CALCIUM 9.1 06/25/2020 1527   PROT 6.6 06/25/2020 1527   ALBUMIN 4.2 06/25/2020 1527   AST 14 06/25/2020 1527   ALT 16 06/25/2020 1527   ALKPHOS 91 06/25/2020 1527   BILITOT 0.3 06/25/2020 1527   GFRNONAA 93 06/25/2020 1527   GFRAA 107 06/25/2020 1527      Latest Ref Rng & Units 06/25/2020    3:27 PM 10/24/2014   12:26 PM  Hepatic Function  Total Protein 6.0 - 8.5 g/dL 6.6  7.3   Albumin 3.8 - 4.9 g/dL 4.2  4.7   AST 0 - 40 IU/L 14  18   ALT 0 - 32 IU/L 16  16   Alk Phosphatase 44 - 121 IU/L 91  69   Total Bilirubin 0.0 - 1.2 mg/dL 0.3  0.4       Current Medications:        Current Outpatient Medications (Other):    Caraway Oil-Levomenthol (FDGARD) 25-20.75 MG CAPS, Take by mouth in the morning and at bedtime.   famotidine (PEPCID) 20 MG tablet, TAKE 1 TABLET(20 MG) BY MOUTH TWICE DAILY   hyoscyamine (LEVSIN SL) 0.125 MG SL tablet, Place 1 tablet (0.125 mg total) under the tongue every 6 (six) hours as needed for cramping (nausea, diarrhea).   Multiple Vitamin (MULTIVITAMIN) tablet, Take 1 tablet by mouth daily.   omeprazole (PRILOSEC) 40 MG capsule, Take 1 capsule (40 mg total) by mouth daily.   polyethylene glycol  powder (GLYCOLAX/MIRALAX) 17 GM/SCOOP powder, Take 17 g by mouth 2 (two) times daily as needed. Daily for 7 days. Then stop   lactobacillus acidophilus (BACID) TABS tablet, Take 2 tablets by mouth 3 (three) times daily.  Medical History:  Past Medical History:  Diagnosis Date   Arthritis    Colon ulcer    GERD (gastroesophageal reflux disease)    Heart murmur    Hiatal hernia    Stomach ulcer    Twisting of colon on long axis (HCC)    Allergies:  Allergies  Allergen Reactions   Ciprofloxacin    Flagyl [Metronidazole Hcl]    Nitrofurantoin Monohyd Macro    Prednisone Other (See Comments)  Abdominal pain.   Sulphur [Sulfur Sublimed]    Penicillin G Hives and Rash   Penicillins Rash     Surgical History:  She  has a past surgical history that includes Colonoscopy and Upper gastrointestinal endoscopy. Family History:  Her family history includes Cirrhosis in her paternal grandfather; Coronary artery disease in her maternal aunt and maternal grandmother; Emphysema in her paternal grandmother; Hyperlipidemia in her father; Hypertension in her father.  REVIEW OF SYSTEMS  : All other systems reviewed and negative except where noted in the History of Present Illness.  PHYSICAL EXAM: BP 110/80   Pulse 97   Ht 5\' 2"  (1.575 m)   Wt 144 lb (65.3 kg)   BMI 26.34 kg/m  General Appearance: Well nourished, in no apparent distress. Head:   Normocephalic and atraumatic. Eyes:  sclerae anicteric,conjunctive pink  Respiratory: Respiratory effort normal Cardio: RRR with no MRGs. Peripheral pulses intact.  Abdomen: Soft,  Non-distended ,active bowel sounds.  Nontender.  Without guarding and Without rebound. No masses. Musculoskeletal: Full ROM, Normal gait. Without edema. Skin:  Dry and intact without significant lesions or rashes Neuro: Alert and  oriented x4;  No focal deficits. Psych:  Cooperative. Normal mood and affect.    Imogene Burn, MD 3:50 PM  I spent 30 minutes of time,  including in depth chart review, independent review of results as outlined above, communicating results with the patient directly, face-to-face time with the patient, coordinating care, ordering studies and medications as appropriate, and documentation.

## 2023-12-26 ENCOUNTER — Encounter: Payer: Self-pay | Admitting: Internal Medicine

## 2024-09-29 ENCOUNTER — Other Ambulatory Visit: Payer: Self-pay | Admitting: Internal Medicine

## 2024-09-29 DIAGNOSIS — R1013 Epigastric pain: Secondary | ICD-10-CM
